# Patient Record
Sex: Female | Born: 1995
Health system: Southern US, Community
[De-identification: ages and names within clinical notes are randomized; demographics above are authoritative.]

## PROBLEM LIST (undated history)

## (undated) DIAGNOSIS — S022XXA Fracture of nasal bones, initial encounter for closed fracture: Secondary | ICD-10-CM

## (undated) DIAGNOSIS — T50901A Poisoning by unspecified drugs, medicaments and biological substances, accidental (unintentional), initial encounter: Secondary | ICD-10-CM

## (undated) DIAGNOSIS — F329 Major depressive disorder, single episode, unspecified: Secondary | ICD-10-CM

## (undated) DIAGNOSIS — F419 Anxiety disorder, unspecified: Secondary | ICD-10-CM

## (undated) DIAGNOSIS — J181 Lobar pneumonia, unspecified organism: Secondary | ICD-10-CM

## (undated) HISTORY — PX: OTHER SURGICAL HISTORY: SHX169

## (undated) HISTORY — DX: Anxiety disorder, unspecified: F41.9

## (undated) HISTORY — DX: Fracture of nasal bones, initial encounter for closed fracture: S02.2XXA

## (undated) HISTORY — DX: Poisoning by unspecified drugs, medicaments and biological substances, accidental (unintentional), initial encounter: T50.901A

## (undated) HISTORY — DX: Major depressive disorder, single episode, unspecified: F32.9

## (undated) HISTORY — DX: Lobar pneumonia, unspecified organism: J18.1

---
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
## 2014-07-01 ENCOUNTER — Telehealth: Payer: Self-pay | Admitting: Internal Medicine

## 2014-07-01 NOTE — Telephone Encounter (Signed)
Pt need a physical  before  8/12 16 can we work her in.

## 2014-07-01 NOTE — Telephone Encounter (Signed)
Lm on Mom voice mail to call back and reschedule .

## 2014-07-01 NOTE — Telephone Encounter (Signed)
yes

## 2014-07-02 NOTE — Telephone Encounter (Signed)
Pt has been sch

## 2014-07-23 ENCOUNTER — Encounter: Payer: BLUE CROSS/BLUE SHIELD | Admitting: Internal Medicine

## 2014-07-23 NOTE — Progress Notes (Signed)
Document opened and reviewed for wellness visit . No showed .  

## 2015-08-20 ENCOUNTER — Telehealth: Payer: Self-pay | Admitting: Family Medicine

## 2015-08-20 NOTE — Telephone Encounter (Signed)
Received a fax from [facility] with a note attached from the patient's mother.  Message stated, "Pamela [name] has lost her medicine and Dr. Marylene Buerger office is not refilling because she missed her last appointment 2 weeks ago.  Pamela [name] is in summer school up in [place] and couldn't make the appointment.  Please -  Please help.  Thanks!  We are in the process of trying to get Pamela [name] in with a practice up in [facility]  Dr. Fabian Sharp reviewed chart.  Pt last seen on 07/05/2013.  Dr. Fabian Sharp recommended that the pt seek help at an urgent care or health center at the college.  I called to speak to the pt.  Received mother of pt.  Informed her that I could not discuss with her and will need to speak to the pt.  She proceeded to inform me that Deloria Lair is in [place] but has found her medication.  Informed her to have Pamela [name] call me if she needed to discuss.  Mom agrees.

## 2015-09-04 ENCOUNTER — Telehealth: Payer: Self-pay | Admitting: Internal Medicine

## 2015-09-04 NOTE — Telephone Encounter (Signed)
Meriam SpragueBeverly called to return [name]'s call and I told her what Dr. Fabian SharpPanosh said and she said that [place] can make an appointment in September to see Dr. Fabian SharpPanosh when she comes home from [place].

## 2015-09-04 NOTE — Telephone Encounter (Signed)
Left a message for a return call.

## 2015-09-04 NOTE — Telephone Encounter (Signed)
Pt need new Rx for duloxetine (CYMBALTA)  Pharm:  [facility] [address]  [place], [place]   Pt state that she is between psychologist and can not get in until September to see them.

## 2015-09-04 NOTE — Telephone Encounter (Signed)
Cannot prescribe medication without  Assessment  Last ov 2 years ago    Would need an  OV with us or another provider in [place]( psychologist dont prescribe medications)  IF she is in school  Go to the health center ans see if they can help in the interim.

## 2016-01-21 NOTE — Progress Notes (Signed)
Pre visit review using our clinic review tool, if applicable. No additional management support is needed unless otherwise documented below in the visit note.  Chief Complaint  Patient presents with  . Abdominal Pain    X6 months  . Weight Loss  . Anorexia  . Nausea  . Emesis    HPI: Pamela [name] 20 y.o.  Last seen over 2 years ago  Hx depression anxiety stable at this time  but comesintoday for acute visit for above .She actually is doing very well just on Cymbalta daily junior in school [employer] and good grades works as a Theatre stage managerhostess at times. She comes in today consider parents were concerned about some symptoms that she has had off and on for a number of months.  Vomited   After eating  When at home and parents concerned  Insidious onset over the last 6 months intermittent a.m. epigastric discomfort and vomiting and has stopped eating much breakfast because of this. No change in bowel habits blood in her stool fever travel.  Lost about 40 pounds  Not really trying  But ok with it    Worse in am sx of nausea and epigastric sx  When no foodin stomach   .   And if eats  Gets some better .    not every day but happens .  Once or twice  A week.   Tries to eat better.   A week ago had fever illness cold . Resolving .  Tobacco no , once a week etoh, no rd , ocass advil .   No diarrhea  .  No episode. Predating above  No one else  Sick  Exercise  Not much  Denies weight problem  Sleep about 8 hours . On cymbalta    For depression.  prescriber    Not a counselor . lmp 3-4 weeks ago  Denies risk preg  Reg menses  ROS: See pertinent positives and negatives per HPI. No UTI symptoms vaginal symptoms unusual rashes bleeding respiratory problems. Denies feeling that she could have a eating disorder.  Past Medical History:  Diagnosis Date  . Anxiety 10/11   Hospital Ingestion benadryl  . Concussion 6/11  . Depression 10/2009   Hospital ingestion benadryl  . DRUG OVERDOSE 10/27/2009   Qualifier:  Diagnosis of  By: Fabian SharpPanosh MD, Neta MendsWanda K   . Fractured nose 6/11  . LLL pneumonia (HCC) 11 /12  . Wheezing-associated respiratory infection (WARI) 03/06/2011    No family history on file.  Social History   Social History  . Marital status: Single    Spouse name: N/A  . Number of children: N/A  . Years of education: N/A   Social History Main Topics  . Smoking status: Never Smoker  . Smokeless tobacco: Never Used  . Alcohol use Yes     Comment: social  . Drug use: No  . Sexual activity: Not Asked   Other Topics Concern  . None   Social History Narrative   Mgm dm cad ?af on coumadin   ? If pgf undiagnosed depression   Paternal great aunt   Mom on wellbutrin and effexor for mom   ? 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       EXAM:  BP 98/68 (BP Location: Right Arm, Patient Position: Sitting, Cuff Size: Normal)  Temp 98.4 F (36.9 C) (Oral)   Wt 135 lb 9.6 oz (61.5 kg)   BMI 21.56 kg/m   Body mass index is 21.56 kg/m.  GENERAL: vitals reviewed and listed above, alert, oriented, appears well hydrated and in no acute distressNon-icteric. HEENT: atraumatic, conjunctiva  clear, no obvious abnormalities on inspection of external nose and ears OP : no lesion edema or exudate  NECK: no obvious masses on inspection palpation  No adenopathy  LUNGS: clear to auscultation bilaterally, no wheezes, rales or rhonchi, good air movement CV: HRRR, no clubbing cyanosis or  peripheral edema nl cap refill  Abdomen:  Sof,t normal bowel sounds without hepatosplenomegaly, no guarding rebound or masses no CVA tenderness Skin: normal capillary refill ,turgor , color: No acute rashes ,petechiae or bruisingm MS: moves all extremities without noticeable focal  abnormality PSYCH: pleasant and cooperative, no obvious depression or anxiety Lab Results  Component Value Date   WBC 7.9 01/23/2016   HGB 12.4 01/23/2016     HCT 37.7 01/23/2016   PLT 509.0 (H) 01/23/2016   GLUCOSE 85 01/23/2016   CHOL 155 07/05/2013   TRIG 125.0 07/05/2013   HDL 50.90 07/05/2013   LDLCALC 79 07/05/2013   ALT 16 01/23/2016   AST 16 01/23/2016   NA 138 01/23/2016   K 4.5 01/23/2016   CL 106 01/23/2016   CREATININE 0.64 01/23/2016   BUN 13 01/23/2016   CO2 27 01/23/2016   TSH 0.75 01/23/2016   HGBA1C 5.7 01/23/2016   Wt Readings from Last 3 Encounters:  01/23/16 135 lb 9.6 oz (61.5 kg)  07/05/13 142 lb (64.4 kg) (76 %, Z= 0.70)*  05/12/13 136 lb 3.2 oz (61.8 kg) (69 %, Z= 0.50)*   * Growth percentiles are based on CDC 2-20 Years data.    ASSESSMENT AND PLAN:  Discussed the following assessment and plan:  Non-intractable vomiting with nausea, unspecified vomiting type - Plan: Basic metabolic panel, CBC with Differential/Platelet, Hepatic function panel, Hemoglobin A1c, TSH, T4, free, POCT urinalysis dipstick, Sedimentation rate, POCT urine pregnancy, Helicobacter pylori antigen det, stool, US Abdomen Complete  Loss of weight - Plan: Basic metabolic panel, CBC with Differential/Platelet, Hepatic function panel, Hemoglobin A1c, TSH, T4, free, POCT urinalysis dipstick, Sedimentation rate, POCT urine pregnancy, Helicobacter pylori antigen det, stool, US Abdomen Complete  Epigastric discomfort - Plan: Basic metabolic panel, CBC with Differential/Platelet, Hepatic function panel, Hemoglobin A1c, TSH, T4, free, POCT urinalysis dipstick, Sedimentation rate, POCT urine pregnancy, Helicobacter pylori antigen det, stool, US Abdomen Complete Plan laboratory studies consideration for peptic ulcer disease but doesn't run in the family no other risk factors considering getting abdominal ultrasound GI consult depending on results. Exam is reassuring today. -Patient advised to return or notify health care team  if symptoms worsen ,persist or new concerns arise.  Patient Instructions  Let she know when lab tests her back in contact  you about getting an abdominal ultrasound test. It is possible you could have a gastritis or an ulcer. Collect stool for H. pylori infection. After all collected we may add an acid blocker and get an opinion from the GI doctors. Follow-up depending on labs in progress.    Neta MendsWanda K. Ryli Standlee M.D.

## 2016-01-23 ENCOUNTER — Encounter: Payer: Self-pay | Admitting: Internal Medicine

## 2016-01-23 ENCOUNTER — Other Ambulatory Visit: Payer: Self-pay | Admitting: Internal Medicine

## 2016-01-23 ENCOUNTER — Ambulatory Visit (INDEPENDENT_AMBULATORY_CARE_PROVIDER_SITE_OTHER): Payer: BLUE CROSS/BLUE SHIELD | Admitting: Internal Medicine

## 2016-01-23 VITALS — BP 98/68 | Temp 98.4°F | Wt 135.6 lb

## 2016-01-23 DIAGNOSIS — R1013 Epigastric pain: Secondary | ICD-10-CM

## 2016-01-23 DIAGNOSIS — R634 Abnormal weight loss: Secondary | ICD-10-CM | POA: Diagnosis not present

## 2016-01-23 DIAGNOSIS — R112 Nausea with vomiting, unspecified: Secondary | ICD-10-CM

## 2016-01-23 LAB — CBC WITH DIFFERENTIAL/PLATELET
Basophils Absolute: 0 10*3/uL (ref 0.0–0.1)
Basophils Relative: 0.4 % (ref 0.0–3.0)
Eosinophils Absolute: 0.1 10*3/uL (ref 0.0–0.7)
Eosinophils Relative: 1 % (ref 0.0–5.0)
HCT: 37.7 % (ref 36.0–46.0)
Hemoglobin: 12.4 g/dL (ref 12.0–15.0)
Lymphocytes Relative: 27.2 % (ref 12.0–46.0)
Lymphs Abs: 2.2 10*3/uL (ref 0.7–4.0)
MCHC: 33 g/dL (ref 30.0–36.0)
MCV: 85.1 fl (ref 78.0–100.0)
Monocytes Absolute: 0.6 10*3/uL (ref 0.1–1.0)
Monocytes Relative: 7.3 % (ref 3.0–12.0)
Neutro Abs: 5.1 10*3/uL (ref 1.4–7.7)
Neutrophils Relative %: 64.1 % (ref 43.0–77.0)
Platelets: 509 10*3/uL — ABNORMAL HIGH (ref 150.0–400.0)
RBC: 4.43 Mil/uL (ref 3.87–5.11)
RDW: 15.6 % — ABNORMAL HIGH (ref 11.5–15.5)
WBC: 7.9 10*3/uL (ref 4.0–10.5)

## 2016-01-23 LAB — POCT URINE PREGNANCY: Preg Test, Ur: NEGATIVE

## 2016-01-23 LAB — BASIC METABOLIC PANEL
BUN: 13 mg/dL (ref 6–23)
CO2: 27 mEq/L (ref 19–32)
Calcium: 9 mg/dL (ref 8.4–10.5)
Chloride: 106 mEq/L (ref 96–112)
Creatinine, Ser: 0.64 mg/dL (ref 0.40–1.20)
GFR: 124.38 mL/min (ref >60.00)
Glucose, Bld: 85 mg/dL (ref 70–99)
Potassium: 4.5 mEq/L (ref 3.5–5.1)
Sodium: 138 mEq/L (ref 135–145)

## 2016-01-23 LAB — POCT URINALYSIS DIP (MANUAL ENTRY)
Bilirubin, UA: NEGATIVE
Blood, UA: NEGATIVE
Glucose, UA: NEGATIVE
Ketones, POC UA: NEGATIVE
Leukocytes, UA: NEGATIVE
Nitrite, UA: NEGATIVE
Spec Grav, UA: 1.015
Urobilinogen, UA: 0.2
pH, UA: 7

## 2016-01-23 LAB — HEMOGLOBIN A1C: Hgb A1c MFr Bld: 5.7 % (ref 4.6–6.5)

## 2016-01-23 LAB — HEPATIC FUNCTION PANEL
ALT: 16 U/L (ref 0–35)
AST: 16 U/L (ref 0–37)
Albumin: 3.7 g/dL (ref 3.5–5.2)
Alkaline Phosphatase: 48 U/L (ref 39–117)
Bilirubin, Direct: 0.1 mg/dL (ref 0.0–0.3)
Total Bilirubin: 0.3 mg/dL (ref 0.2–1.2)
Total Protein: 5.7 g/dL — ABNORMAL LOW (ref 6.0–8.3)

## 2016-01-23 LAB — TSH: TSH: 0.75 u[IU]/mL (ref 0.35–4.50)

## 2016-01-23 LAB — T4, FREE: Free T4: 0.83 ng/dL (ref 0.60–1.60)

## 2016-01-23 LAB — SEDIMENTATION RATE: Sed Rate: 6 mm/hr (ref 0–20)

## 2016-01-23 NOTE — Patient Instructions (Signed)
Let she know when lab tests her back in contact you about getting an abdominal ultrasound test. It is possible you could have a gastritis or an ulcer. Collect stool for H. pylori infection. After all collected we may add an acid blocker and get an opinion from the GI doctors. Follow-up depending on labs in progress.

## 2016-01-28 LAB — HELICOBACTER PYLORI  SPECIAL ANTIGEN: H. PYLORI Antigen: NOT DETECTED

## 2016-02-06 ENCOUNTER — Other Ambulatory Visit: Payer: Self-pay | Admitting: Family Medicine

## 2016-02-06 MED ORDER — RANITIDINE HCL 150 MG PO TABS: 150.0000 mg | ORAL_TABLET | Freq: Two times a day (BID) | ORAL | 1 refills | Status: DC

## 2016-04-23 DIAGNOSIS — Y906 Blood alcohol level of 120-199 mg/100 ml: Secondary | ICD-10-CM | POA: Diagnosis not present

## 2016-04-23 DIAGNOSIS — F329 Major depressive disorder, single episode, unspecified: Secondary | ICD-10-CM | POA: Diagnosis not present

## 2016-04-23 DIAGNOSIS — F10120 Alcohol abuse with intoxication, uncomplicated: Secondary | ICD-10-CM | POA: Diagnosis not present

## 2016-04-23 DIAGNOSIS — R45851 Suicidal ideations: Secondary | ICD-10-CM | POA: Diagnosis not present

## 2016-10-15 ENCOUNTER — Encounter: Payer: Self-pay | Admitting: Internal Medicine

## 2017-01-28 ENCOUNTER — Telehealth: Payer: Self-pay | Admitting: Internal Medicine

## 2017-01-28 NOTE — Telephone Encounter (Signed)
LM for mother Meriam SpragueBeverly making aware that the patient will need an OV before prescribing this medication.

## 2017-01-28 NOTE — Telephone Encounter (Signed)
Mother calling to request a refill of the patients Cymbalta to [facility].  Fax received this morning for the same request and has been dicussed with Dr Fabian SharpPanosh.  Cymbalta has never been filled by Dr Fabian SharpPanosh or our office.  Last seen 12/2015 by Dr Fabian SharpPanosh.  Pt mother aware that Dr Fabian SharpPanosh wants to review to the patient's chart before saying yes or no.  Aware that we will back.

## 2017-01-28 NOTE — Telephone Encounter (Signed)
Needs OV  If needs us to prescribe medication.   Last Ov with me was  Over a year ago .  And I havent been prescribing this medicine .

## 2017-02-02 NOTE — Telephone Encounter (Signed)
LM for mother Pamela [name] x 2

## 2017-02-09 NOTE — Telephone Encounter (Signed)
LM x 3 Mychart message sent.  Will close encounter.

## 2018-01-27 NOTE — Progress Notes (Signed)
Chief Complaint  Patient presents with  . Annual Exam    Wants to talk about about getting a nonhormonal IUD. Is going to [place] and needs vaccines     HPI: Patient  Pamela [name]  23 y.o. comes in today for Preventive Health Care visit  [employer]   Major communication.  science and disorders.  Just gaduated  Wants to do more in   Speech therapy.  Was seeing prescriber for depression on campus and has 90 days at grad but will need a new prescribe and can we do this? Seems ok and denies suicidal or  New sx  Function ing fairly well. No local counselor but agrees could be a good idea  Has had vag exam    No pap.   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  Will need immuniz and from utd  And medical cert to be done   Health Maintenance  Topic Date Due  . HIV Screening  12/15/2009  . TETANUS/TDAP  12/15/2013  . INFLUENZA VACCINE  08/25/2017  . PAP-Cervical Cytology Screening  01/30/2018 (Originally 12/16/2015)  . PAP SMEAR-Modifier  01/30/2018 (Originally 12/16/2015)   Health Maintenance Review LIFESTYLE:  Exercise:   Not a lot .  In an aout.  Tobacco/ETS:no Alcohol: 2 per week  Sugar beverages:n Sleep: about out 7 houors  Drug use: no  HH of  At home   ROS:  GEN/ HEENT: No fever, significant weight changes sweats headaches vision problems hearing changes, CV/ PULM; No chest pain shortness of breath cough, syncope,edema  change in exercise tolerance. GI /GU: om going abd sx when runs but no, vomiting, change in bowel habits. No blood in the stool. No significant GU symptoms. No weight loss w this  Never followed up wioth the abd pain over 2 years ago  SKIN/HEME: ,no acute skin rashes suspicious lesions or bleeding. No lymphadenopathy, nodules, masses.  NEURO/ PSYCH:  No neurologic signs such as weakness numbness. No depression anxiety. IMM/ Allergy: No unusual infections.  Allergy .   REST of 12 system review negative except as per HPI   Past Medical History:   Diagnosis Date  . Anxiety 10/11   Hospital Ingestion benadryl  . Concussion 6/11  . Depression 10/2009   Hospital ingestion benadryl  . DRUG OVERDOSE 10/27/2009   Qualifier: Diagnosis of  By: Regis Bill MD, Standley Brooking   . Fractured nose 6/11  . LLL pneumonia ([facility]) 11 /12  . Wheezing-associated respiratory infection (WARI) 03/06/2011    Past Surgical History:  Procedure Laterality Date  . Nose fracture surgery      No family history on file.  Social History   Socioeconomic History  . Marital status: Single    Spouse name: Not on file  . Number of children: Not on file  . Years of education: Not on file  . Highest education level: Not on file  Occupational History  . Not on file  Social Needs  . Financial resource strain: Not on file  . Food insecurity:    Worry: Not on file    Inability: Not on file  . Transportation needs:    Medical: Not on file    Non-medical: Not on file  Tobacco Use  . Smoking status: Never Smoker  . Smokeless tobacco: Never Used  Substance and Sexual Activity  . Alcohol use: Yes    Alcohol/week: 4.0 standard drinks    Types: 4 Cans of beer per week  Comment: social  . Drug use: No  . Sexual activity: Not Currently  Lifestyle  . Physical activity:    Days per week: Not on file    Minutes per session: Not on file  . Stress: Not on file  Relationships  . Social connections:    Talks on phone: Not on file    Gets together: Not on file    Attends religious service: Not on file    Active member of club or organization: Not on file    Attends meetings of clubs or organizations: Not on file    Relationship status: Not on file  Other Topics Concern  . Not on file  Social History Narrative   Mgm dm cad ?af on coumadin   ? If pgf undiagnosed depression   Paternal great aunt   Mom on wellbutrin and effexor for mom   ? 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     Outpatient Medications Prior to Visit  Medication Sig Dispense Refill  . DULoxetine (CYMBALTA) 30 MG capsule Take 90 mg by mouth daily.    . ranitidine (ZANTAC) 150 MG tablet Take 1 tablet (150 mg total) by mouth 2 (two) times daily. 60 tablet 1   No facility-administered medications prior to visit.      EXAM:  BP 116/62 (BP Location: Left Arm, Patient Position: Sitting, Cuff Size: Normal)   Pulse 88   Temp 98 F (36.7 C) (Oral)   Ht 5' 6.5" (1.689 m)   Wt 160 lb 3.2 oz (72.7 kg)   LMP 01/03/2018 (Approximate)   BMI 25.47 kg/m   Body mass index is 25.47 kg/m. Wt Readings from Last 3 Encounters:  01/30/18 160 lb 3.2 oz (72.7 kg)  01/23/16 135 lb 9.6 oz (61.5 kg)  07/05/13 142 lb (64.4 kg) (76 %, Z= 0.70)*   * Growth percentiles are based on CDC (Girls, 2-20 Years) data.    Physical Exam: Vital signs reviewed NTZ:GYFV is a well-developed well-nourished alert cooperative    who appearsr stated age in no acute distress.  HEENT: normocephalic atraumatic , Eyes: PERRL EOM's full, conjunctiva clear, Nares: paten,t no deformity discharge or tenderness., Ears: no deformity EAC's clear TMs with normal landmarks. Mouth: clear OP, no lesions, edema.  Moist mucous membranes. Dentition in adequate repair. NECK: supple without masses, thyromegaly or bruits. CHEST/PULM:  Clear to auscultation and percussion breath sounds equal no wheeze , rales or rhonchi. No chest wall deformities or tenderness. Breast: normal by inspection . No dimpling, discharge, masses, tenderness or discharge . CV: PMI is nondisplaced, S1 S2 no gallops, murmurs, rubs. Peripheral pulses are full without delay.No JVD .  ABDOMEN: Bowel sounds normal nontender  No guard or rebound, no hepato splenomegal no CVA tenderness.  No hernia. Extremtities:  No clubbing cyanosis or edema, no acute joint swelling or redness no focal atrophy NEURO:  Oriented x3, cranial nerves 3-12 appear to be intact, no obvious focal  weakness,gait within normal limits no abnormal reflexes or asymmetrical SKIN: No acute rashes normal turgor, color, no bruising or petechiae. PSYCH: Oriented, good eye contact, no obvious depression anxiety, cognition and judgment appear normal. LN: no cervical axillary inguinal adenopathy  Lab Results  Component Value Date   WBC 7.9 01/23/2016   HGB 12.4 01/23/2016   HCT 37.7 01/23/2016   PLT 509.0 (H) 01/23/2016  GLUCOSE 85 01/23/2016   CHOL 155 07/05/2013   TRIG 125.0 07/05/2013   HDL 50.90 07/05/2013   LDLCALC 79 07/05/2013   ALT 16 01/23/2016   AST 16 01/23/2016   NA 138 01/23/2016   K 4.5 01/23/2016   CL 106 01/23/2016   CREATININE 0.64 01/23/2016   BUN 13 01/23/2016   CO2 27 01/23/2016   TSH 0.75 01/23/2016   HGBA1C 5.7 01/23/2016    BP Readings from Last 3 Encounters:  01/30/18 116/62  01/23/16 98/68  07/05/13 114/74     ASSESSMENT AND PLAN:  Discussed the following assessment and plan:  Visit for preventive health examination  Medication management  Depression, major, single episode, complete remission ([place]) - apparent remission on high dose cymbalta and ok to take over meds  at this time disc local counseling [place] if needed  Stomach ache - hx of such off and on for years  no preogression had initial eval  check into diet changes fu if progressive   .try lactose free diet.consdier celiac screen   Encounter for other general counseling or advice on contraception - dsic iud avoiding hromonal cause of poss mood se  advise get gyne appt   Immunization counseling Dysmenorrhea.  Sounds primary  Contraceptive need and will need pap  Needs medical certificate done but need her immuniz childhood seem  utd x tdap?   Send in  Typhoid rx and then   She will bring back the form for completion after getting immuniz records  Prolonged visit today  Get pap and iud consult via gyne and sti screen if needed (?low risk) Patient Care Team: Hanya Guerin, Standley Brooking, MD as PCP  - General Oneil,Leslie Patient Instructions  [place] for [place] to   Take over the Cymbalta  . Can refill with [place]  Get appt with   Gyne   For IUD.      And  Pap smear as indicated .   [facility]  [phone]   But there are other options  consider  Lactose elimination  Diet .  For chronic abd pain .   [place] send in typhoid vaccine   And will need to get yellow fever vaccine at  [facility] department or travle  Department.   Need  You to get a copy of full immunization record .  State registry idoes not show your  mmr and  Looks like  Your are due for tdap before travel .  You can make appt for nurse visit   For these injections if needed before travel.   Consider getting in with local counselor.     Preventive Care for [place], Female The transition to life after high school as a young adult can be a stressful time with many changes. You may start seeing a primary care physician instead of a pediatrician. This is the time when your health care becomes your responsibility. Preventive care refers to lifestyle choices and visits with your health care provider that can promote health and wellness. What does preventive care include?  A yearly physical exam. This is also called an annual wellness visit.  Dental exams once or twice a year.  Routine eye exams. Ask your health care provider how often you should have your eyes checked.  Personal lifestyle choices, including: ? Daily care of your teeth and gums. ? Regular physical activity. ? Eating a healthy diet. ? Avoiding tobacco and drug use. ? Avoiding or limiting alcohol use. ? Practicing safe sex. ? Taking  vitamin and mineral supplements as recommended by your health care provider. What happens during an annual wellness visit? Preventive care starts with a yearly visit to your primary care physician. The services and screenings done by your health care provider during your annual wellness visit will depend on your overall health,  lifestyle risk factors, and family history of disease. Counseling Your health care provider may ask you questions about:  Past medical problems and your family's medical history.  Medicines or supplements you take.  Health insurance and access to health care.  Alcohol, tobacco, and drug use.  Your safety at home, work, or school.  Access to firearms.  Emotional well-being and how you cope with stress.  Relationship well-being.  Diet, exercise, and sleep habits.  Your sexual health and activity.  Your methods of birth control.  Your menstrual cycle.  Your pregnancy history. Screening You may have the following tests or measurements:  Height, weight, and BMI.  Blood pressure.  Lipid and cholesterol levels.  Tuberculosis skin test.  Skin exam.  Vision and hearing tests.  Screening test for hepatitis.  Screening tests for sexually transmitted diseases (STDs), if you are at risk.  BRCA-related cancer screening. This may be done if you have a family history of breast, ovarian, tubal, or peritoneal cancers.  Pelvic exam and Pap test. This may be done every 3 years starting at age 27. Vaccines Your health care provider may recommend certain vaccines, such as:  Influenza vaccine. This is recommended every year.  Tetanus, diphtheria, and acellular pertussis (Tdap, Td) vaccine. You may need a Td booster every 10 years.  Varicella vaccine. You may need this if you have not been vaccinated.  HPV vaccine. If you are 44 or younger, you may need three doses over 6 months.  Measles, mumps, and rubella (MMR) vaccine. You may need at least one dose of MMR. You may also need a second dose.  Pneumococcal 13-valent conjugate (PCV13) vaccine. You may need this if you have certain conditions and were not previously vaccinated.  Pneumococcal polysaccharide (PPSV23) vaccine. You may need one or two doses if you smoke cigarettes or if you have certain  conditions.  Meningococcal vaccine. One dose is recommended if you are age 64-21 years and a first-year college student living in a residence hall, or if you have one of several medical conditions. You may also need additional booster doses.  Hepatitis A vaccine. You may need this if you have certain conditions or if you travel or work in places where you may be exposed to hepatitis A.  Hepatitis B vaccine. You may need this if you have certain conditions or if you travel or work in places where you may be exposed to hepatitis B.  Haemophilus influenzae type b (Hib) vaccine. You may need this if you have certain risk factors. Talk to your health care provider about which screenings and vaccines you need and how often you need them. What steps can I take to develop healthy behaviors?      Have regular preventive health care visits with your primary care physician and dentist.  Eat a healthy diet.  Drink enough fluid to keep your urine pale yellow.  Stay active. Exercise at least 30 minutes 5 or more days of the week.  Use alcohol responsibly.  Maintain a healthy weight.  Do not use any products that contain nicotine, such as cigarettes, chewing tobacco, and e-cigarettes. If you need help quitting, ask your health care provider.  Do not  use drugs.  Practice safe sex.  Use birth control (contraception) to prevent unwanted pregnancy. If you plan to become pregnant, see your health care provider for a pre-conception visit.  Find healthy ways to manage stress. How can I protect myself from injury? Injuries from violence or accidents are the leading cause of death among young adults and can often be prevented. Take these steps to help protect yourself:  Always wear your seat belt while driving or riding in a vehicle.  Do not drive if you have been drinking alcohol. Do not ride with someone who has been drinking.  Do not drive when you are tired or distracted. Do not text while  driving.  Wear a helmet and other protective equipment during sports activities.  If you have firearms in your house, make sure you follow all gun safety procedures.  Seek help if you have been bullied, physically abused, or sexually abused.  Use the Internet responsibly to avoid dangers such as online bullying and online sexual predators. What can I do to cope with stress? Young adults may face many new challenges that can be stressful, such as finding a job, going to college, moving away from home, managing money, being in a relationship, getting married, and having children. To manage stress:  Avoid known stressful situations when you can.  Exercise regularly.  Find a stress-reducing activity that works best for you. Examples include meditation, yoga, listening to music, or reading.  Spend time in nature.  Keep a journal to write about your stress and how you respond.  Talk to your health care provider about stress. He or she may suggest counseling.  Spend time with supportive friends or family.  Do not cope with stress by: ? Drinking alcohol or using drugs. ? Smoking cigarettes. ? Eating. Where can I get more information? Learn more about preventive care and healthy habits from:  [place] and Gynecologists: KaraokeExchange.nl  U.S. Probation officer Task Force: StageSync.si  National Adolescent and [place]: StrategicRoad.nl  American Academy of Pediatrics Bright Futures: https://brightfutures.MemberVerification.co.za  Society for Adolescent Health and Medicine: MoralBlog.co.za.aspx  PodExchange.nl: ToyLending.fr This information is not intended to replace advice given to you by your health care provider. Make  sure you discuss any questions you have with your health care provider. Document Released: 05/29/2015 Document Revised: 08/24/2016 Document Reviewed: 05/29/2015 Elsevier Interactive Patient Education  2019 [place] K. Jennafer Gladue M.D.

## 2018-01-30 ENCOUNTER — Encounter: Payer: Self-pay | Admitting: Internal Medicine

## 2018-01-30 ENCOUNTER — Ambulatory Visit (INDEPENDENT_AMBULATORY_CARE_PROVIDER_SITE_OTHER): Payer: BLUE CROSS/BLUE SHIELD | Admitting: Internal Medicine

## 2018-01-30 VITALS — BP 116/62 | HR 88 | Temp 98.0°F | Ht 66.5 in | Wt 160.2 lb

## 2018-01-30 DIAGNOSIS — R109 Unspecified abdominal pain: Secondary | ICD-10-CM

## 2018-01-30 DIAGNOSIS — Z0000 Encounter for general adult medical examination without abnormal findings: Secondary | ICD-10-CM

## 2018-01-30 DIAGNOSIS — Z79899 Other long term (current) drug therapy: Secondary | ICD-10-CM | POA: Diagnosis not present

## 2018-01-30 DIAGNOSIS — Z3009 Encounter for other general counseling and advice on contraception: Secondary | ICD-10-CM | POA: Diagnosis not present

## 2018-01-30 DIAGNOSIS — F325 Major depressive disorder, single episode, in full remission: Secondary | ICD-10-CM

## 2018-01-30 DIAGNOSIS — Z7189 Other specified counseling: Secondary | ICD-10-CM | POA: Diagnosis not present

## 2018-01-30 DIAGNOSIS — Z7185 Encounter for immunization safety counseling: Secondary | ICD-10-CM

## 2018-01-30 MED ORDER — TYPHOID VACCINE PO CPDR: 1.0000 | DELAYED_RELEASE_CAPSULE | ORAL | 0 refills | Status: DC

## 2018-01-30 MED ORDER — DULOXETINE HCL 60 MG PO CPEP: 60.0000 mg | ORAL_CAPSULE | Freq: Two times a day (BID) | ORAL | 0 refills | Status: DC

## 2018-01-30 NOTE — Patient Instructions (Addendum)
[place] for [place] to   Take over the Cymbalta  . Can refill with [place]  Get appt with   Gyne   For IUD.      And  Pap smear as indicated .   [facility]  [phone]   But there are other options  consider  Lactose elimination  Diet .  For chronic abd pain .   [place] send in typhoid vaccine   And will need to get yellow fever vaccine at  [facility] department or travle  Department.   Need  You to get a copy of full immunization record .  State registry idoes not show your  mmr and  Looks like  Your are due for tdap before travel .  You can make appt for nurse visit   For these injections if needed before travel.   Consider getting in with local counselor.     Preventive Care for [place], Female The transition to life after high school as a young adult can be a stressful time with many changes. You may start seeing a primary care physician instead of a pediatrician. This is the time when your health care becomes your responsibility. Preventive care refers to lifestyle choices and visits with your health care provider that can promote health and wellness. What does preventive care include?  A yearly physical exam. This is also called an annual wellness visit.  Dental exams once or twice a year.  Routine eye exams. Ask your health care provider how often you should have your eyes checked.  Personal lifestyle choices, including: ? Daily care of your teeth and gums. ? Regular physical activity. ? Eating a healthy diet. ? Avoiding tobacco and drug use. ? Avoiding or limiting alcohol use. ? Practicing safe sex. ? Taking vitamin and mineral supplements as recommended by your health care provider. What happens during an annual wellness visit? Preventive care starts with a yearly visit to your primary care physician. The services and screenings done by your health care provider during your annual wellness visit will depend on your overall health, lifestyle risk factors, and family history of  disease. Counseling Your health care provider may ask you questions about:  Past medical problems and your family's medical history.  Medicines or supplements you take.  Health insurance and access to health care.  Alcohol, tobacco, and drug use.  Your safety at home, work, or school.  Access to firearms.  Emotional well-being and how you cope with stress.  Relationship well-being.  Diet, exercise, and sleep habits.  Your sexual health and activity.  Your methods of birth control.  Your menstrual cycle.  Your pregnancy history. Screening You may have the following tests or measurements:  Height, weight, and BMI.  Blood pressure.  Lipid and cholesterol levels.  Tuberculosis skin test.  Skin exam.  Vision and hearing tests.  Screening test for hepatitis.  Screening tests for sexually transmitted diseases (STDs), if you are at risk.  BRCA-related cancer screening. This may be done if you have a family history of breast, ovarian, tubal, or peritoneal cancers.  Pelvic exam and Pap test. This may be done every 3 years starting at age 65. Vaccines Your health care provider may recommend certain vaccines, such as:  Influenza vaccine. This is recommended every year.  Tetanus, diphtheria, and acellular pertussis (Tdap, Td) vaccine. You may need a Td booster every 10 years.  Varicella vaccine. You may need this if you have not been vaccinated.  HPV vaccine. If  you are 11 or younger, you may need three doses over 6 months.  Measles, mumps, and rubella (MMR) vaccine. You may need at least one dose of MMR. You may also need a second dose.  Pneumococcal 13-valent conjugate (PCV13) vaccine. You may need this if you have certain conditions and were not previously vaccinated.  Pneumococcal polysaccharide (PPSV23) vaccine. You may need one or two doses if you smoke cigarettes or if you have certain conditions.  Meningococcal vaccine. One dose is recommended if you are  age 31-21 years and a first-year college student living in a residence hall, or if you have one of several medical conditions. You may also need additional booster doses.  Hepatitis A vaccine. You may need this if you have certain conditions or if you travel or work in places where you may be exposed to hepatitis A.  Hepatitis B vaccine. You may need this if you have certain conditions or if you travel or work in places where you may be exposed to hepatitis B.  Haemophilus influenzae type b (Hib) vaccine. You may need this if you have certain risk factors. Talk to your health care provider about which screenings and vaccines you need and how often you need them. What steps can I take to develop healthy behaviors?      Have regular preventive health care visits with your primary care physician and dentist.  Eat a healthy diet.  Drink enough fluid to keep your urine pale yellow.  Stay active. Exercise at least 30 minutes 5 or more days of the week.  Use alcohol responsibly.  Maintain a healthy weight.  Do not use any products that contain nicotine, such as cigarettes, chewing tobacco, and e-cigarettes. If you need help quitting, ask your health care provider.  Do not use drugs.  Practice safe sex.  Use birth control (contraception) to prevent unwanted pregnancy. If you plan to become pregnant, see your health care provider for a pre-conception visit.  Find healthy ways to manage stress. How can I protect myself from injury? Injuries from violence or accidents are the leading cause of death among young adults and can often be prevented. Take these steps to help protect yourself:  Always wear your seat belt while driving or riding in a vehicle.  Do not drive if you have been drinking alcohol. Do not ride with someone who has been drinking.  Do not drive when you are tired or distracted. Do not text while driving.  Wear a helmet and other protective equipment during sports  activities.  If you have firearms in your house, make sure you follow all gun safety procedures.  Seek help if you have been bullied, physically abused, or sexually abused.  Use the Internet responsibly to avoid dangers such as online bullying and online sexual predators. What can I do to cope with stress? Young adults may face many new challenges that can be stressful, such as finding a job, going to college, moving away from home, managing money, being in a relationship, getting married, and having children. To manage stress:  Avoid known stressful situations when you can.  Exercise regularly.  Find a stress-reducing activity that works best for you. Examples include meditation, yoga, listening to music, or reading.  Spend time in nature.  Keep a journal to write about your stress and how you respond.  Talk to your health care provider about stress. He or she may suggest counseling.  Spend time with supportive friends or family.  Do not  cope with stress by: ? Drinking alcohol or using drugs. ? Smoking cigarettes. ? Eating. Where can I get more information? Learn more about preventive care and healthy habits from:  [place] and Gynecologists: KaraokeExchange.nl  U.S. Probation officer Task Force: StageSync.si  National Adolescent and [place]: StrategicRoad.nl  American Academy of Pediatrics Bright Futures: https://brightfutures.MemberVerification.co.za  Society for Adolescent Health and Medicine: MoralBlog.co.za.aspx  PodExchange.nl: ToyLending.fr This information is not intended to replace advice given to you by your health care provider. Make sure you discuss any questions you have with your health care  provider. Document Released: 05/29/2015 Document Revised: 08/24/2016 Document Reviewed: 05/29/2015 Elsevier Interactive Patient Education  2019 [employer].

## 2018-02-09 ENCOUNTER — Ambulatory Visit: Payer: BLUE CROSS/BLUE SHIELD | Admitting: Internal Medicine

## 2018-02-09 ENCOUNTER — Encounter: Payer: Self-pay | Admitting: Internal Medicine

## 2018-02-09 ENCOUNTER — Ambulatory Visit: Payer: Self-pay | Admitting: Internal Medicine

## 2018-02-09 VITALS — BP 120/72 | HR 88 | Temp 98.9°F | Wt 158.0 lb

## 2018-02-09 DIAGNOSIS — Z6825 Body mass index (BMI) 25.0-25.9, adult: Secondary | ICD-10-CM | POA: Diagnosis not present

## 2018-02-09 DIAGNOSIS — R21 Rash and other nonspecific skin eruption: Secondary | ICD-10-CM

## 2018-02-09 DIAGNOSIS — Z01419 Encounter for gynecological examination (general) (routine) without abnormal findings: Secondary | ICD-10-CM | POA: Diagnosis not present

## 2018-02-09 DIAGNOSIS — Z79899 Other long term (current) drug therapy: Secondary | ICD-10-CM | POA: Diagnosis not present

## 2018-02-09 DIAGNOSIS — Z113 Encounter for screening for infections with a predominantly sexual mode of transmission: Secondary | ICD-10-CM | POA: Diagnosis not present

## 2018-02-09 DIAGNOSIS — F325 Major depressive disorder, single episode, in full remission: Secondary | ICD-10-CM | POA: Diagnosis not present

## 2018-02-09 DIAGNOSIS — Z124 Encounter for screening for malignant neoplasm of cervix: Secondary | ICD-10-CM | POA: Diagnosis not present

## 2018-02-09 DIAGNOSIS — Z23 Encounter for immunization: Secondary | ICD-10-CM

## 2018-02-09 DIAGNOSIS — Z3040 Encounter for surveillance of contraceptives, unspecified: Secondary | ICD-10-CM | POA: Diagnosis not present

## 2018-02-09 MED ORDER — DULOXETINE HCL 60 MG PO CPEP: 60.0000 mg | ORAL_CAPSULE | Freq: Two times a day (BID) | ORAL | 0 refills | Status: DC

## 2018-02-09 MED ORDER — MUPIROCIN CALCIUM 2 % EX CREA: 1.0000 "application " | TOPICAL_CREAM | Freq: Three times a day (TID) | CUTANEOUS | 3 refills | Status: DC

## 2018-02-09 NOTE — Patient Instructions (Addendum)
  Flu vaccine and tdap today .   Meds  sent to your pharmacy .   Have a great experience

## 2018-02-09 NOTE — Progress Notes (Signed)
Chief Complaint  Patient presents with  . 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 come in for update immuniz   Form  And rx  cymbalta 60 bid and bactroban for intermittent facial ? Staph infection  ( not hxv and not in same place)  Seen by derm in past  Form  needed for  overeseas program   Needs refill meds for a year  immuniz update  Feels depression is stable and doing ok    ROS: See pertinent positives and negatives per HPI.  Past Medical History:  Diagnosis Date  . Anxiety 10/11   Hospital Ingestion benadryl  . Concussion 6/11  . Depression 10/2009   Hospital ingestion benadryl  . DRUG OVERDOSE 10/27/2009   Qualifier: Diagnosis of  By: Fabian Sharp MD, Neta Mends   . Fractured nose 6/11  . LLL pneumonia (HCC) 11 /12  . Wheezing-associated respiratory infection (WARI) 03/06/2011    No family history on file.  Social History   Socioeconomic History  . Marital status: Single    Spouse name: Not on file  . Number of children: Not on file  . Years of education: Not on file  . Highest education level: Not on file  Occupational History  . Not on file  Social Needs  . Financial resource strain: Not on file  . Food insecurity:    Worry: Not on file    Inability: Not on file  . Transportation needs:    Medical: Not on file    Non-medical: Not on file  Tobacco Use  . Smoking status: Never Smoker  . Smokeless tobacco: Never Used  Substance and Sexual Activity  . Alcohol use: Yes    Alcohol/week: 4.0 standard drinks    Types: 4 Cans of beer per week    Comment: social  . Drug use: No  . Sexual activity: Not Currently  Lifestyle  . Physical activity:    Days per week: Not on file    Minutes per session: Not on file  . Stress: Not on file  Relationships  . Social connections:    Talks on phone: Not on file    Gets together: Not on file    Attends religious service: Not on file    Active member of club or  organization: Not on file    Attends meetings of clubs or organizations: Not on file    Relationship status: Not on file  Other Topics Concern  . Not on file  Social History Narrative   Mgm dm cad ?af on coumadin   ? If pgf undiagnosed depression   Paternal great aunt   Mom on wellbutrin and effexor for mom   ? 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    Outpatient Medications Prior to Visit  Medication Sig Dispense Refill  . typhoid (VIVOTIF) DR capsule Take 1 capsule by mouth every other day. To be completed pre travel 4 capsule 0  . DULoxetine (CYMBALTA) 60 MG capsule Take 1 capsule (60 mg total) by mouth 2 (two) times daily. 10 months of medication patient out of country  March to December 2020 600 capsule 0   No facility-administered medications prior to visit.      EXAM:  BP 120/72 (BP Location: Right  Arm, Patient Position: Sitting, Cuff Size: Normal)   Pulse 88   Temp 98.9 F (37.2 C) (Oral)   Wt 158 lb (71.7 kg)   BMI 25.12 kg/m   Body mass index is 25.12 kg/m.  GENERAL: vitals reviewed and listed above, alert, oriented, appears well hydrated and in no acute distress HEENT: atraumatic, conjunctiva  clear, no obvious abnormalities on inspection of external nose and ears MS: moves all extremities without noticeable focal  abnormality PSYCH: pleasant and cooperative, no obvious depression or anxiety Lab Results  Component Value Date   WBC 7.9 01/23/2016   HGB 12.4 01/23/2016   HCT 37.7 01/23/2016   PLT 509.0 (H) 01/23/2016   GLUCOSE 85 01/23/2016   CHOL 155 07/05/2013   TRIG 125.0 07/05/2013   HDL 50.90 07/05/2013   LDLCALC 79 07/05/2013   ALT 16 01/23/2016   AST 16 01/23/2016   NA 138 01/23/2016   K 4.5 01/23/2016   CL 106 01/23/2016   CREATININE 0.64 01/23/2016   BUN 13 01/23/2016   CO2 27 01/23/2016   TSH 0.75 01/23/2016   HGBA1C 5.7 01/23/2016   BP Readings from Last 3  Encounters:  02/09/18 120/72  01/30/18 116/62  01/23/16 98/68    ASSESSMENT AND PLAN:  Discussed the following assessment and plan:  Medication management  Need for influenza vaccination - Plan: Flu Vaccine QUAD 6+ mos PF IM (Fluarix Quad PF)  Need for Tdap vaccination - Plan: Tdap vaccine greater than or equal to 7yo IM  Rash recurrent hx   - ok to rx bactroban as per hx   Depression, major, single episode, complete remission (HCC)  Immunization due Record review is actually up-to-date on hepatitis A but needs flu vaccine and it appears updated tetanus shot Tdap last one was 2008. Form completed letter written okay for travel prescriptions refilled.  Has had yellow fever vaccine in past  Form and requests to be sent ot scan -Patient advised to return or notify health care team  if  new concerns arise.  Patient Instructions   Flu vaccine and tdap today .   Meds  sent to your pharmacy .   Have a great experience      Florine Sprenkle K. Zackery Brine M.D.

## 2018-02-14 ENCOUNTER — Other Ambulatory Visit: Payer: Self-pay | Admitting: Internal Medicine

## 2018-02-20 ENCOUNTER — Other Ambulatory Visit: Payer: Self-pay

## 2018-02-20 NOTE — Telephone Encounter (Signed)
Pt is requesting a year supply please advise  Due to being gone from Feb:27 -Dec 10th  Last Ov:02/09/2018 Last filled :02/16/2018

## 2018-02-24 NOTE — Telephone Encounter (Signed)
I have ordered  rx alreay on e on Jan 6 for 600 and one on jan 16 for 1 year   Please advise since  This is another request that  Should have already been placed

## 2018-03-08 DIAGNOSIS — D224 Melanocytic nevi of scalp and neck: Secondary | ICD-10-CM | POA: Diagnosis not present

## 2018-03-08 DIAGNOSIS — L0889 Other specified local infections of the skin and subcutaneous tissue: Secondary | ICD-10-CM | POA: Diagnosis not present

## 2018-03-08 DIAGNOSIS — D2261 Melanocytic nevi of right upper limb, including shoulder: Secondary | ICD-10-CM | POA: Diagnosis not present

## 2018-03-08 DIAGNOSIS — D2262 Melanocytic nevi of left upper limb, including shoulder: Secondary | ICD-10-CM | POA: Diagnosis not present

## 2018-03-08 DIAGNOSIS — L0109 Other impetigo: Secondary | ICD-10-CM | POA: Diagnosis not present

## 2018-03-08 DIAGNOSIS — D2221 Melanocytic nevi of right ear and external auricular canal: Secondary | ICD-10-CM | POA: Diagnosis not present

## 2018-04-14 ENCOUNTER — Ambulatory Visit: Payer: Self-pay | Admitting: Internal Medicine

## 2018-04-14 NOTE — Telephone Encounter (Signed)
I've been traveling..    5 days ago I had fever for 2-3 days.   No body aches or chills.     Wet cough.   Had for one and 1/2 weeks.   No shortness of breath.   No coronavirus exposure that I'm aware of.  I've been traveling over seas.   Been in 10 airports.    I've been in [place], [place].   These areas are not on the list now for high incidence of the coronavirus.    I instructed her to self quarantine for 14 days.   Wash hands frequently, don't touch face, etc.   Check temperature twice a day.   She needs to get a thermometer.   I encouraged her to get someone to get a thermometer for her.  I went over the symptoms for her to watch for and call us back.   She verbalized understanding and was agreeable to this plan.      Reason for Disposition . [1] Caller concerned that exposure to COVID-19 occurred BUT [2] does not meet COVID-19 EXPOSURE criteria from [facility]  Answer Assessment - Initial Assessment Questions 1. CONFIRMED CASE: "Who is the person with the confirmed COVID-19 infection that you were exposed to?"     None that I know of.   I've been traveling overseas and been in 10 airports in [place] and [place]. 2. PLACE of CONTACT: "Where were you when you were exposed to COVID-19  (coronavirus disease 2019)?" (e.g., city, state, country)     I've not been exposed to my knowledge.   3. TYPE of CONTACT: "How much contact was there?" (e.g., live in same house, work in same office, same school)     None aware of 4. DATE of CONTACT: "When did you have contact with a coronavirus patient?" (e.g., days)     N/A 5. DURATION of CONTACT: "How long were you in contact with the COVID-19 (coronavirus disease) patient?" (e.g., a few seconds, passed by person, a few minutes, live with the patient)     N/A 6. SYMPTOMS: "Do you have any symptoms?" (e.g., fever, cough, breathing difficulty)     5 days ago I had a fever for 2-3 days.   It's gone now.  I have a "wet cough" that I've had for 1 and 1/2 weeks".    Denies  shortness of breath.   7. PREGNANCY OR POSTPARTUM: "Is there any chance you are pregnant?" "When was your last menstrual period?" "Did you deliver in the last 2 weeks?"     Not asked  8. HIGH RISK: "Do you have any heart or lung problems? Do you have a weakened immune system?" (e.g., CHF, COPD, asthma, HIV positive, chemotherapy, renal failure, diabetes mellitus, sickle cell anemia)     No.    I was just wondering since I had been traveling if I needed to be tested.   Just in case.  Protocols used: CORONAVIRUS (COVID-19) EXPOSURE-A-AH

## 2018-08-01 ENCOUNTER — Ambulatory Visit: Payer: Self-pay | Admitting: Internal Medicine

## 2018-08-01 DIAGNOSIS — W540XXA Bitten by dog, initial encounter: Secondary | ICD-10-CM | POA: Diagnosis not present

## 2018-08-01 DIAGNOSIS — Z1159 Encounter for screening for other viral diseases: Secondary | ICD-10-CM | POA: Diagnosis not present

## 2018-08-01 DIAGNOSIS — L03211 Cellulitis of face: Secondary | ICD-10-CM | POA: Diagnosis not present

## 2018-08-01 NOTE — Telephone Encounter (Signed)
Clinical call placed, called pt back, pt presently at [facility]. States her dog bit her lip last night. States she decided to go to [facility] in case she needed sutures. Assured [place] would route to practice to alert Dr. Regis Bill.   Reason for Disposition . [1] Puncture wound or small cut AND [2] on face  Protocols used: ANIMAL BITE-A-AH

## 2018-08-16 DIAGNOSIS — Z111 Encounter for screening for respiratory tuberculosis: Secondary | ICD-10-CM | POA: Diagnosis not present

## 2018-08-19 DIAGNOSIS — Z111 Encounter for screening for respiratory tuberculosis: Secondary | ICD-10-CM | POA: Diagnosis not present

## 2018-12-18 DIAGNOSIS — Z20828 Contact with and (suspected) exposure to other viral communicable diseases: Secondary | ICD-10-CM | POA: Diagnosis not present

## 2019-01-20 DIAGNOSIS — Z03818 Encounter for observation for suspected exposure to other biological agents ruled out: Secondary | ICD-10-CM | POA: Diagnosis not present

## 2019-03-08 ENCOUNTER — Telehealth: Payer: Self-pay | Admitting: Internal Medicine

## 2019-03-08 NOTE — Telephone Encounter (Signed)
Medication Refill: Generic Cymbalta  Pharmacy: [facility] FAX: [phone]   Pharmacy called pt is out

## 2019-03-09 NOTE — Telephone Encounter (Signed)
Lvm pt needs an appt for this to be filled can be virtual appt

## 2019-03-10 ENCOUNTER — Other Ambulatory Visit: Payer: Self-pay | Admitting: Internal Medicine

## 2019-03-12 ENCOUNTER — Other Ambulatory Visit: Payer: Self-pay

## 2019-03-12 ENCOUNTER — Encounter: Payer: Self-pay | Admitting: Internal Medicine

## 2019-03-12 ENCOUNTER — Telehealth (INDEPENDENT_AMBULATORY_CARE_PROVIDER_SITE_OTHER): Payer: BC Managed Care – PPO | Admitting: Internal Medicine

## 2019-03-12 DIAGNOSIS — L089 Local infection of the skin and subcutaneous tissue, unspecified: Secondary | ICD-10-CM | POA: Diagnosis not present

## 2019-03-12 DIAGNOSIS — F325 Major depressive disorder, single episode, in full remission: Secondary | ICD-10-CM | POA: Diagnosis not present

## 2019-03-12 DIAGNOSIS — Z79899 Other long term (current) drug therapy: Secondary | ICD-10-CM

## 2019-03-12 MED ORDER — DULOXETINE HCL 60 MG PO CPEP: 120.0000 mg | ORAL_CAPSULE | Freq: Every day | ORAL | 1 refills | Status: DC

## 2019-03-12 MED ORDER — MUPIROCIN CALCIUM 2 % EX CREA: 1.0000 "application " | TOPICAL_CREAM | Freq: Three times a day (TID) | CUTANEOUS | 2 refills | Status: DC

## 2019-03-12 NOTE — Progress Notes (Signed)
Virtual Visit via Video Note  I connected with@ on 03/12/19 at  9:00 AM EST by a video enabled telemedicine application and verified that I am speaking with the correct person using two identifiers. Location patient: home Location provider:work  office Persons participating in the virtual visit: patient, provider  WIth national recommendations  regarding COVID 19 pandemic   video visit is advised over in office visit for this patient.  Patient aware  of the limitations of evaluation and management by telemedicine and  availability of in person appointments. and agreed to proceed.   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   Tad: no tob 2-3 x per week etoh maybe  Sleep 8 hours  Tries to get some etoh    No major change in health  Has had pap at some point  ROS: See pertinent positives and negatives per HPI.  Past Medical History:  Diagnosis Date  . Anxiety 10/11   Hospital Ingestion benadryl  . Concussion 6/11  . Depression 10/2009   Hospital ingestion benadryl  . DRUG OVERDOSE 10/27/2009   Qualifier: Diagnosis of  By: Regis Bill MD, Standley Brooking   . Fractured nose 6/11  . LLL pneumonia 11 /12  . Wheezing-associated respiratory infection (WARI) 03/06/2011    Past Surgical History:  Procedure Laterality Date  . Nose fracture surgery      History reviewed. No pertinent family history.  Social History   Tobacco Use  . Smoking  status: Never Smoker  . Smokeless tobacco: Never Used  Substance Use Topics  . Alcohol use: Yes    Alcohol/week: 4.0 standard drinks    Types: 4 Cans of beer per week    Comment: social  . Drug use: No      Current Outpatient Medications:  .  DULoxetine (CYMBALTA) 60 MG capsule, Take 2 capsules (120 mg total) by mouth daily., Disp: 180 capsule, Rfl: 1 .  mupirocin cream (BACTROBAN) 2 %, Apply 1 application topically 3 (three) times daily. For skin infection, Disp: 30 g, Rfl: 2  EXAM: BP Readings from Last 3 Encounters:  02/09/18 120/72  01/30/18 116/62  01/23/16 98/68    VITals   upat visits borderline  But ok in EHR  BP Readings from Last 3 Encounters:  02/09/18 120/72  01/30/18 116/62  01/23/16 98/68     GENERAL: alert, oriented, appears well and in no acute distress  HEENT: atraumatic, conjunttiva clear, no obvious abnormalities on inspection of external nose and ears  NECK: normal movements of the head and neck  LUNGS: on inspection no signs of respiratory distress, breathing rate appears normal, no obvious gross SOB, gasping or wheezing  CV: no obvious cyanosis  PSYCH/NEURO: pleasant and cooperative, no obvious depression or anxiety, speech and thought processing grossly intact   ASSESSMENT AND PLAN:  Discussed the following assessment and plan:  ICD-10-CM   1. Medication management  Z79.899   2. Depression, major, single episode, complete remission ([place])  F32.5   3. Superficial skin infection recurrent face   L08.9   Benefit more than risk of medications  to continue. Plan cpx before leaves for grad schooll or  6 months est   . At this time no change in meds  consider get back in counseling if  Appropriate. Cont  Adaptic lifestyle  Counseled.   Expectant management and discussion of plan and treatment with opportunity to ask questions and all were answered. The patient agreed with the plan and demonstrated an understanding of the instructions.    Advised to call back or seek an in-person evaluation if worsening  or having  further concerns . Return for preventive /cpx and medications in 4-6 months  or as needed . Outside external source  DATA REVIEWED:   ehr  Med management  Total time on date  of service including record review ordering and plan of care:   30 minutes   Shanon Ace, MD

## 2019-03-16 DIAGNOSIS — H6011 Cellulitis of right external ear: Secondary | ICD-10-CM | POA: Diagnosis not present

## 2019-03-16 DIAGNOSIS — H6981 Other specified disorders of Eustachian tube, right ear: Secondary | ICD-10-CM | POA: Diagnosis not present

## 2019-03-19 DIAGNOSIS — F419 Anxiety disorder, unspecified: Secondary | ICD-10-CM | POA: Diagnosis not present

## 2019-03-26 DIAGNOSIS — F419 Anxiety disorder, unspecified: Secondary | ICD-10-CM | POA: Diagnosis not present

## 2019-04-04 DIAGNOSIS — F419 Anxiety disorder, unspecified: Secondary | ICD-10-CM | POA: Diagnosis not present

## 2019-04-16 DIAGNOSIS — F419 Anxiety disorder, unspecified: Secondary | ICD-10-CM | POA: Diagnosis not present

## 2019-05-07 ENCOUNTER — Ambulatory Visit: Payer: 59 | Admitting: Internal Medicine

## 2019-05-07 NOTE — Progress Notes (Deleted)
This visit occurred during the SARS-CoV-2 public health emergency.  Safety protocols were in place, including screening questions prior to the visit, additional usage of staff PPE, and extensive cleaning of exam room while observing appropriate contact time as indicated for disinfecting solutions.    No chief complaint on file.   HPI: Pamela [name] 24 y.o. come in for ROS: See pertinent positives and negatives per HPI.  Past Medical History:  Diagnosis Date  . Anxiety 10/11   Hospital Ingestion benadryl  . Concussion 6/11  . Depression 10/2009   Hospital ingestion benadryl  . DRUG OVERDOSE 10/27/2009   Qualifier: Diagnosis of  By: Fabian Sharp MD, Neta Mends   . Fractured nose 6/11  . LLL pneumonia 11 /12  . Wheezing-associated respiratory infection (WARI) 03/06/2011    No family history on file.  Social History   Socioeconomic History  . Marital status: Single    Spouse name: Not on file  . Number of children: Not on file  . Years of education: Not on file  . Highest education level: Not on file  Occupational History  . Not on file  Tobacco Use  . Smoking status: Never Smoker  . Smokeless tobacco: Never Used  Substance and Sexual Activity  . Alcohol use: Yes    Alcohol/week: 4.0 standard drinks    Types: 4 Cans of beer per week    Comment: social  . Drug use: No  . Sexual activity: Not Currently  Other Topics Concern  . Not on file  Social History Narrative   Mgm dm cad ?af on coumadin   ? If pgf undiagnosed depression   Paternal great aunt   Mom on wellbutrin and effexor for mom   ? 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   Social Determinants of Health   Financial Resource Strain:   . Difficulty of Paying Living Expenses:   Food Insecurity:   . Worried About Programme researcher, broadcasting/film/video in the Last Year:   . Barista in the Last Year:   Transportation Needs:   . Freight forwarder  (Medical):   Marland Kitchen Lack of Transportation (Non-Medical):   Physical Activity:   . Days of Exercise per Week:   . Minutes of Exercise per Session:   Stress:   . Feeling of Stress :   Social Connections:   . Frequency of Communication with Friends and Family:   . Frequency of Social Gatherings with Friends and Family:   . Attends Religious Services:   . Active Member of Clubs or Organizations:   . Attends Banker Meetings:   Marland Kitchen Marital Status:     Outpatient Medications Prior to Visit  Medication Sig Dispense Refill  . DULoxetine (CYMBALTA) 60 MG capsule Take 2 capsules (120 mg total) by mouth daily. 180 capsule 1  . mupirocin cream (BACTROBAN) 2 % Apply 1 application topically 3 (three) times daily. For skin infection 30 g 2   No facility-administered medications prior to visit.     EXAM:  There were no vitals taken for this visit.  There is no height or weight on file to calculate BMI.  GENERAL: vitals reviewed and listed above, alert, oriented, appears well hydrated and in no acute distress HEENT: atraumatic, conjunctiva  clear, no obvious abnormalities on inspection of external nose and ears OP :  no lesion edema or exudate  NECK: no obvious masses on inspection palpation  LUNGS: clear to auscultation bilaterally, no wheezes, rales or rhonchi, good air movement CV: HRRR, no clubbing cyanosis or  peripheral edema nl cap refill  MS: moves all extremities without noticeable focal  abnormality PSYCH: pleasant and cooperative, no obvious depression or anxiety Lab Results  Component Value Date   WBC 7.9 01/23/2016   HGB 12.4 01/23/2016   HCT 37.7 01/23/2016   PLT 509.0 (H) 01/23/2016   GLUCOSE 85 01/23/2016   CHOL 155 07/05/2013   TRIG 125.0 07/05/2013   HDL 50.90 07/05/2013   LDLCALC 79 07/05/2013   ALT 16 01/23/2016   AST 16 01/23/2016   NA 138 01/23/2016   K 4.5 01/23/2016   CL 106 01/23/2016   CREATININE 0.64 01/23/2016   BUN 13 01/23/2016   CO2 27  01/23/2016   TSH 0.75 01/23/2016   HGBA1C 5.7 01/23/2016   BP Readings from Last 3 Encounters:  02/09/18 120/72  01/30/18 116/62  01/23/16 98/68    ASSESSMENT AND PLAN:  Discussed the following assessment and plan:  No diagnosis found.  -Patient advised to return or notify health care team  if  new concerns arise.  There are no Patient Instructions on file for this visit.   Standley Brooking. Emmilee Reamer M.D.

## 2019-06-23 ENCOUNTER — Other Ambulatory Visit: Payer: Self-pay | Admitting: Internal Medicine

## 2019-06-26 ENCOUNTER — Other Ambulatory Visit: Payer: Self-pay | Admitting: Internal Medicine

## 2019-11-02 ENCOUNTER — Other Ambulatory Visit: Payer: Self-pay | Admitting: Internal Medicine

## 2019-11-08 ENCOUNTER — Telehealth: Payer: Self-pay | Admitting: Internal Medicine

## 2019-11-08 ENCOUNTER — Other Ambulatory Visit: Payer: Self-pay | Admitting: Internal Medicine

## 2019-11-08 NOTE — Telephone Encounter (Signed)
Patient called pharmacy and requested a refill on Duloxetine.  She wants to make sure we got the pharmacy request because she is out of this medication.  She thought she had another bottle but she doesn't so accidentally ran out.

## 2019-11-08 NOTE — Telephone Encounter (Signed)
Spoke with the pt and informed her the Rx was previously sent to [facility] on 10/10.

## 2019-11-12 NOTE — Telephone Encounter (Signed)
See last ov was supposed to have a fu in person in summer    Will refill 30 days  Make her appt  Video ok   Before runs out  oof med

## 2019-11-30 DIAGNOSIS — Z20822 Contact with and (suspected) exposure to covid-19: Secondary | ICD-10-CM | POA: Diagnosis not present

## 2019-12-06 ENCOUNTER — Other Ambulatory Visit: Payer: Self-pay | Admitting: Internal Medicine

## 2019-12-06 NOTE — Telephone Encounter (Signed)
Last OV 03/12/19 Last refill 11/12/19 #60/0 Next OV not scheduled

## 2019-12-19 ENCOUNTER — Telehealth: Payer: Self-pay | Admitting: Internal Medicine

## 2019-12-19 NOTE — Telephone Encounter (Signed)
Ok to do a referral but I dont know  Psychiatry   [place] of [place] .  Assume dx is  Depression and medication

## 2019-12-19 NOTE — Telephone Encounter (Signed)
Left message to return call. Needing more information on what referral is needed and why.

## 2019-12-19 NOTE — Telephone Encounter (Signed)
Pt call and want a referral and want a call back.

## 2019-12-19 NOTE — Telephone Encounter (Signed)
Please advise 

## 2019-12-19 NOTE — Telephone Encounter (Signed)
Pt is calling back stating that she needs a referral to a psychiatrist and her insurance is requiring her to have one in order for her to make an appointment.  Pt state that she is in [place] in school and would like to see if we can do the referral to somewhere there if she gets the information and give it to [place]. And she will be back her at christmas time to visit with her parents.  Pt is aware that we do not do across the state referrals but she wanted to ask.

## 2019-12-19 NOTE — Telephone Encounter (Signed)
Left message to return call. Needing to know if patient wants to see someone in [place] or if she will be coming back here

## 2019-12-24 NOTE — Telephone Encounter (Signed)
Called lvm for patient to call office to let us know what office she wants referral to go to

## 2020-02-02 ENCOUNTER — Other Ambulatory Visit: Payer: Self-pay | Admitting: Internal Medicine

## 2020-02-28 ENCOUNTER — Other Ambulatory Visit: Payer: Self-pay | Admitting: Internal Medicine

## 2020-03-25 ENCOUNTER — Other Ambulatory Visit: Payer: Self-pay | Admitting: Internal Medicine

## 2020-03-27 ENCOUNTER — Other Ambulatory Visit: Payer: Self-pay

## 2020-03-27 ENCOUNTER — Telehealth: Payer: Self-pay | Admitting: Internal Medicine

## 2020-03-27 MED ORDER — DULOXETINE HCL 60 MG PO CPEP: ORAL_CAPSULE | ORAL | 0 refills | Status: DC

## 2020-03-27 NOTE — Telephone Encounter (Signed)
Patient is requesting a refill on Cymbalta 60 mg.  Patient is requesting a 60 or 90 day supply.   Pharmacy: [facility] on [address] in [place].

## 2020-03-27 NOTE — Telephone Encounter (Signed)
Patient is currently in [place], my chart visit scheduled for 04/10/20 @10am .   Thirty day supply sent to [facility] pharmacy in [facility]

## 2020-04-02 ENCOUNTER — Other Ambulatory Visit: Payer: Self-pay | Admitting: Internal Medicine

## 2020-04-10 ENCOUNTER — Telehealth (INDEPENDENT_AMBULATORY_CARE_PROVIDER_SITE_OTHER): Payer: Self-pay | Admitting: Internal Medicine

## 2020-04-10 ENCOUNTER — Other Ambulatory Visit: Payer: Self-pay

## 2020-04-10 ENCOUNTER — Encounter: Payer: Self-pay | Admitting: Internal Medicine

## 2020-04-10 DIAGNOSIS — Z79899 Other long term (current) drug therapy: Secondary | ICD-10-CM

## 2020-04-10 DIAGNOSIS — R41840 Attention and concentration deficit: Secondary | ICD-10-CM

## 2020-04-10 DIAGNOSIS — F324 Major depressive disorder, single episode, in partial remission: Secondary | ICD-10-CM

## 2020-04-10 MED ORDER — DULOXETINE HCL 60 MG PO CPEP
ORAL_CAPSULE | ORAL | 1 refills | Status: DC
Start: 2020-04-10 — End: 2020-09-02

## 2020-04-10 NOTE — Progress Notes (Signed)
Virtual Visit via Telephone Note  I connected with@ on 04/10/20 at 10:00 AM EDT by telephone and verified that I am speaking with the correct person using two identifiers.   I discussed the limitations, risks, security and privacy concerns of performing an evaluation and management service by telephone and the limited availability of in person appointments. tThere may be a patient responsible charge related to this service. The patient expressed understanding and agreed to proceed.  Location patient: home Location provider: home office Participants present for the call: patient, provider Patient did not have a visit in the prior 7 days to address this/these issue(s). Patient attempted to do video visit and was unable to get onto my chart so this was transitioned to a phone visit.  She was last seen on a telemedicine visit February 2021 for medicines.    History of Present Illness: Pamela [name] presents as above requesting medication refill last seen on a telemedicine visit in February 2021.  She is in [place].  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  She is doing okay has seen a counselor 2 times recently states that her mood is up and down but she never gets stuck in it more than a couple weeks not suicidal somewhat stressed There is some question if she could have attentional deficit and her counselor has given her some information for areas to reassess. Sleeping okay cutting down on any alcohol negative TAD goes to the gym has not had a checkup in a while probably due for Pap smear but not on birth control.  No major changes in health otherwise. Comes home only for a brief times in the summer and at holidays. Last plan was to come in person for a physical last summer but this did not happen.       Observations/Objective: Patient sounds personable and well on the phone. I do not appreciate any SOB. Speech  and thought processing are grossly intact. Patient reported vitals: Lab Results  Component Value Date   WBC 7.9 01/23/2016   HGB 12.4 01/23/2016   HCT 37.7 01/23/2016   PLT 509.0 (H) 01/23/2016   GLUCOSE 85 01/23/2016   CHOL 155 07/05/2013   TRIG 125.0 07/05/2013   HDL 50.90 07/05/2013   LDLCALC 79 07/05/2013   ALT 16 01/23/2016   AST 16 01/23/2016   NA 138 01/23/2016   K 4.5 01/23/2016   CL 106 01/23/2016   CREATININE 0.64 01/23/2016   BUN 13 01/23/2016   CO2 27 01/23/2016   TSH 0.75 01/23/2016   HGBA1C 5.7 01/23/2016    Assessment and Plan:  We will refill medication at this time for the next 6 months.  In her best interest would plan an in person visit if continuing care but will help with medicine transition.  Possible but okay to transition care in [place] but she will be there another year or so. Agree with staying with counselor and reaching out to evaluation for ADD whether a prescribing clinician specialist or even psychiatrist as indicated. Can also use her health services for a well woman check. Refill medication for the next 6 months and then she will touch base with [place] about plans going forward. Reviewed reaching out to her local help and family if things are going well.  She agrees with plan.  Follow Up Instructions:  6 mos  In person or visit  Contact  .   [id number] [id number] [id number] 11-20 [id number] 21-30 I did not refer this patient for an OV in the next 24 hours for this/these issue(s).  I discussed the assessment and treatment plan with the patient. The patient was provided an opportunity to ask questions and answered. The patient agreed with the plan and demonstrated an understanding of the instructions.   The patient was advised to call back or seek an in-person evaluationas indicated  I provided 22  minutes of non-face-to-face time during this encounter. Return in about 6 months (around 10/11/2020).  Berniece Andreas, MD

## 2020-08-06 DIAGNOSIS — F900 Attention-deficit hyperactivity disorder, predominantly inattentive type: Secondary | ICD-10-CM | POA: Diagnosis not present

## 2020-09-01 ENCOUNTER — Other Ambulatory Visit: Payer: Self-pay | Admitting: Internal Medicine

## 2020-09-22 ENCOUNTER — Telehealth: Payer: Self-pay | Admitting: Internal Medicine

## 2020-09-22 DIAGNOSIS — Z23 Encounter for immunization: Secondary | ICD-10-CM | POA: Diagnosis not present

## 2020-09-22 NOTE — Telephone Encounter (Signed)
Patient needs a chicken pox booster vaccine for school but it requires a prescription. Also needs a Hep B booster.     Good callback number is [phone]

## 2020-09-22 NOTE — Telephone Encounter (Signed)
I spoke with the pt and informed her that she would need to come into the office for visit in order to receive vaccinations. Pt stated that she is going out of town tomorrow and would need booster shots by today. I informed pt that we have no opening and the next available appt would be tomorrow. Pt decline appt.

## 2020-09-22 NOTE — Telephone Encounter (Signed)
Would need more information therefore a work in visit is okay Do not have her original vaccine records. Need to see the form to be able to assess for appropriate injections. Varicella vaccine is given in a series of 2 in adulthood so the request is not clear.

## 2020-09-22 NOTE — Telephone Encounter (Signed)
I spoke with the pt and she stated that she will be receiving vaccinations at [facility] and she does not need assistance from PCP.

## 2020-10-03 DIAGNOSIS — F900 Attention-deficit hyperactivity disorder, predominantly inattentive type: Secondary | ICD-10-CM | POA: Diagnosis not present

## 2020-10-07 DIAGNOSIS — F900 Attention-deficit hyperactivity disorder, predominantly inattentive type: Secondary | ICD-10-CM | POA: Diagnosis not present

## 2020-10-16 ENCOUNTER — Other Ambulatory Visit: Payer: Self-pay | Admitting: Internal Medicine

## 2020-11-04 DIAGNOSIS — L739 Follicular disorder, unspecified: Secondary | ICD-10-CM | POA: Diagnosis not present

## 2020-11-04 DIAGNOSIS — L705 Acne excoriee des jeunes filles: Secondary | ICD-10-CM | POA: Diagnosis not present

## 2020-11-04 DIAGNOSIS — D239 Other benign neoplasm of skin, unspecified: Secondary | ICD-10-CM | POA: Diagnosis not present

## 2020-11-27 DIAGNOSIS — D485 Neoplasm of uncertain behavior of skin: Secondary | ICD-10-CM | POA: Diagnosis not present

## 2020-11-27 DIAGNOSIS — D2372 Other benign neoplasm of skin of left lower limb, including hip: Secondary | ICD-10-CM | POA: Diagnosis not present

## 2021-01-12 ENCOUNTER — Other Ambulatory Visit: Payer: Self-pay | Admitting: Internal Medicine

## 2021-01-28 ENCOUNTER — Ambulatory Visit: Payer: BC Managed Care – PPO | Admitting: Internal Medicine

## 2021-01-28 ENCOUNTER — Encounter: Payer: Self-pay | Admitting: Internal Medicine

## 2021-01-30 DIAGNOSIS — Z111 Encounter for screening for respiratory tuberculosis: Secondary | ICD-10-CM | POA: Diagnosis not present

## 2021-02-02 DIAGNOSIS — Z111 Encounter for screening for respiratory tuberculosis: Secondary | ICD-10-CM | POA: Diagnosis not present

## 2021-02-03 ENCOUNTER — Ambulatory Visit (INDEPENDENT_AMBULATORY_CARE_PROVIDER_SITE_OTHER): Payer: BC Managed Care – PPO | Admitting: Internal Medicine

## 2021-02-03 VITALS — BP 110/70 | HR 111 | Temp 98.9°F | Ht 66.14 in | Wt 163.4 lb

## 2021-02-03 DIAGNOSIS — Z79899 Other long term (current) drug therapy: Secondary | ICD-10-CM

## 2021-02-03 DIAGNOSIS — F325 Major depressive disorder, single episode, in full remission: Secondary | ICD-10-CM

## 2021-02-03 DIAGNOSIS — F900 Attention-deficit hyperactivity disorder, predominantly inattentive type: Secondary | ICD-10-CM

## 2021-02-03 MED ORDER — METHYLPHENIDATE HCL ER (OSM) 18 MG PO TBCR: 18.0000 mg | EXTENDED_RELEASE_TABLET | Freq: Every day | ORAL | 0 refills | Status: DC

## 2021-02-03 NOTE — Patient Instructions (Addendum)
Good to see you today .  Trial low dose stimulant   may benefit from  higher dose or different  type of med.   Take in am to avoid  interfering with sleep . This is controlled substance so lock  up. And do not share.   Avoid substances that can interfere with memory or mood.

## 2021-02-03 NOTE — Progress Notes (Signed)
Chief Complaint  Patient presents with   ADHD    HPI: [place] F[place] 26 y.o. come in for review and evaluation for attentional difficulties. Depression is stable see PHQ-9. 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  She had had difficulty focusing attention losing things even at an early age.  She [facility] neuropsychological [facility] [place] [facility] [place] [place] the report is in the chart attached to the MyChart visit.  This was done on August 06, 2020 based on history and test performance and patterns diagnostic impression was ADHD predominantly inattentive presentation.  It was recommended to have environmental edges accommodations and behaviors to consult with PCP regarding appropriate nests of medication.  Negative T rare D and alcohol does regular exercise when not during testing time  There is some social anxiety but is doing well and hopeful and likes her studies.  See last  visit 3 2022  ROS: See pertinent positives and negatives per HPI. Periods  ok  Past Medical History:  Diagnosis Date   Anxiety 10/11   Hospital Ingestion benadryl   Concussion 6/11   Depression 10/2009   Hospital ingestion benadryl   DRUG OVERDOSE 10/27/2009   Qualifier: Diagnosis of  By: Fabian SharpPanosh MD, Neta MendsWanda K    Fractured nose 6/11   LLL pneumonia 11 /12   Wheezing-associated respiratory infection (WARI) 03/06/2011    History reviewed. No pertinent family history.  Social History   Socioeconomic History   Marital status: Single    Spouse name: Not on file   Number of children: Not on file   Years of education: Not on file   Highest education level: Master's degree (e.g., MA, MS, MEng, MEd, MSW, MBA)  Occupational History    Not on file  Tobacco Use   Smoking status: Never   Smokeless tobacco: Never  Substance and Sexual Activity   Alcohol use: Yes    Alcohol/week: 4.0 standard drinks    Types: 4 Cans of beer per week    Comment: social   Drug use: No   Sexual activity: Not Currently  Other Topics Concern   Not on file  Social History Narrative   Mgm dm cad ?af on coumadin   ? If pgf undiagnosed depression   Paternal great aunt   Mom on wellbutrin and effexor for mom   ? 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   Social Determinants of Health   Financial Resource Strain: Low Risk    Difficulty of Paying Living Expenses: Not hard at all  Food Insecurity: No Food Insecurity   Worried About Programme researcher, broadcasting/film/videounning Out of Food in the Last Year: Never true   Baristaan Out of Food in the Last Year: Never true  Transportation Needs: No Transportation Needs   Lack of Transportation (Medical): No   Lack of Transportation (Non-Medical): No  Physical Activity: Sufficiently Active   Days of Exercise per Week: 4 days   Minutes of Exercise per Session: 60 min  Stress: No Stress Concern Present   Feeling of Stress : Only a little  Social Connections: Socially Isolated   Frequency of  Communication with Friends and Family: More than three times a week   Frequency of Social Gatherings with Friends and Family: Twice a week   Attends Religious Services: Never   Database administrator or Organizations: No   Attends Engineer, structural: Not on file   Marital Status: Never married    Outpatient Medications Prior to Visit  Medication Sig Dispense Refill   DULoxetine (CYMBALTA) 60 MG capsule TAKE 2 CAPSULES BY MOUTH DAILY 60 capsule 1   mupirocin cream (BACTROBAN) 2 % Apply 1 application topically 3 (three) times daily. For skin infection (Patient not taking: Reported on 02/03/2021) 30 g 2   mupirocin ointment (BACTROBAN) 2 % APPLY TO THE AFFECTED  AREA THREE TIMES DAILY (Patient not taking: Reported on 02/03/2021) 22 g 0   No facility-administered medications prior to visit.     EXAM:  BP 110/70 (BP Location: Right Arm, Patient Position: Sitting, Cuff Size: Normal)    Pulse (!) 111    Temp 98.9 F (37.2 C) (Oral)    Ht 5' 6.14" (1.68 m)    Wt 163 lb 6.4 oz (74.1 kg)    SpO2 98%    BMI 26.26 kg/m   Body mass index is 26.26 kg/m.  GENERAL: vitals reviewed and listed above, alert, oriented, appears well hydrated and in no acute distress HEENT: atraumatic, conjunctiva  clear, no obvious abnormalities on inspection of external nose and ears OP : masked  NECK: no obvious masses on inspection palpation  LUNGS: clear to auscultation bilaterally, no wheezes, rales or rhonchi, good air movement CV: HRRR, no clubbing cyanosis or  peripheral edema nl cap refill  Abdomen:  Sof,t normal bowel sounds without hepatosplenomegaly, no guarding rebound or masses no CVA tenderness  MS: moves all extremities without noticeable focal  abnormality PSYCH: pleasant and cooperative, no obvious depression or anxiety Lab Results  Component Value Date   WBC 7.9 01/23/2016   HGB 12.4 01/23/2016   HCT 37.7 01/23/2016   PLT 509.0 (H) 01/23/2016   GLUCOSE 85 01/23/2016   CHOL 155 07/05/2013   TRIG 125.0 07/05/2013   HDL 50.90 07/05/2013   LDLCALC 79 07/05/2013   ALT 16 01/23/2016   AST 16 01/23/2016   NA 138 01/23/2016   K 4.5 01/23/2016   CL 106 01/23/2016   CREATININE 0.64 01/23/2016   BUN 13 01/23/2016   CO2 27 01/23/2016   TSH 0.75 01/23/2016   HGBA1C 5.7 01/23/2016   BP Readings from Last 3 Encounters:  02/03/21 110/70  02/09/18 120/72  01/30/18 116/62  Reviewed report from Dion Saucier PhD [facility] licensed psychologist  See [email or web address]  ASSESSMENT AND PLAN:  Discussed the following assessment and plan:  ADHD (attention deficit hyperactivity disorder), inattentive type - see neuropsych evaluation july 2022  Medication  management  Depression, major, single episode, complete remission (HCC) Past history of anxiety hospitalization and drug overdose benadry years ago and depression but appears  to be stable and controlled although  I agree with her tha in future she may benefit from psychiatric  management .  Risk benefit of medication discussed.   Will begin low dose  methylphenidate concerta  adjust dose  consider vyvanse   also  Continue other  advise for attention avoid  SU ( not a problem now)   Plan   contact 3-4 weeks  visit virtual ok 3 mos or less.   Check with pharmacy  rules about out of state  prescribing of schedule 2 meds  -Patient  advised to return or notify health care team  if  new concerns arise. Read 7 page report evaluate visit counsel on new medication follow-up side effects potential.  40 minutes Patient Instructions  Good to see you today .  Trial low dose stimulant   may benefit from  higher dose or different  type of med.   Take in am to avoid  interfering with sleep . This is controlled substance so lock  up. And do not share.   Avoid substances that can interfere with memory or mood.   Neta Mends. Kaelan Emami M.D.

## 2021-02-05 ENCOUNTER — Telehealth: Payer: BC Managed Care – PPO | Admitting: Internal Medicine

## 2021-03-08 ENCOUNTER — Other Ambulatory Visit: Payer: Self-pay | Admitting: Internal Medicine

## 2021-03-09 ENCOUNTER — Encounter: Payer: Self-pay | Admitting: Internal Medicine

## 2021-03-09 ENCOUNTER — Telehealth (INDEPENDENT_AMBULATORY_CARE_PROVIDER_SITE_OTHER): Payer: BC Managed Care – PPO | Admitting: Internal Medicine

## 2021-03-09 DIAGNOSIS — F325 Major depressive disorder, single episode, in full remission: Secondary | ICD-10-CM

## 2021-03-09 DIAGNOSIS — Z79899 Other long term (current) drug therapy: Secondary | ICD-10-CM

## 2021-03-09 DIAGNOSIS — F900 Attention-deficit hyperactivity disorder, predominantly inattentive type: Secondary | ICD-10-CM

## 2021-03-09 MED ORDER — LISDEXAMFETAMINE DIMESYLATE 20 MG PO CAPS: 20.0000 mg | ORAL_CAPSULE | Freq: Every day | ORAL | 0 refills | Status: DC

## 2021-03-09 NOTE — Progress Notes (Signed)
Virtual Visit via Video Note  I connected with Pamela [name] on 03/09/21 at  8:30 AM EST by a video enabled telemedicine application and verified that I am speaking with the correct person using two identifiers. Location patient: home Location provider:workoffice Persons participating in the virtual visit: patient, provider  [place] national recommendations  regarding COVID 19 pandemic   video visit is advised over in office visit for this patient.  Patient aware  of the limitations of evaluation and management by telemedicine and  availability of in person appointments. and agreed to proceed.   HPI: Pamela [name] presents for video visit in regard to beginning low-dose Concerta generic for attentional difficulties.  She states it did not really make that much difference that she noticed in her attention and focusing however she did have a couple days premenstrually where she cried spells for no obvious reason except it was before her.  She does get moody before her period.  She did not hold off on the medicine because she was not sure if it was the medicine causing it.  She is doing okay currently does not have an active counselor but has been in counseling before.  Continues with her schoolwork to graduate in May uncertain if going to stay in [place] or come back [place].    ROS: See pertinent positives and negatives per HPI.  Past Medical History:  Diagnosis Date   Anxiety 10/11   Hospital Ingestion benadryl   Concussion 6/11   Depression 10/2009   Hospital ingestion benadryl   DRUG OVERDOSE 10/27/2009   Qualifier: Diagnosis of  By: Fabian Sharp MD, Neta Mends    Fractured nose 6/11   LLL pneumonia 11 /12   Wheezing-associated respiratory infection (WARI) 03/06/2011    Past Surgical History:  Procedure Laterality Date   Nose fracture surgery      History reviewed. No pertinent family history.  Social History   Tobacco Use   Smoking status: Never   Smokeless tobacco: Never   Substance Use Topics   Alcohol use: Yes    Alcohol/week: 4.0 standard drinks    Types: 4 Cans of beer per week    Comment: social   Drug use: No      Current Outpatient Medications:    lisdexamfetamine (VYVANSE) 20 MG capsule, Take 1 capsule (20 mg total) by mouth daily., Disp: 30 capsule, Rfl: 0   DULoxetine (CYMBALTA) 60 MG capsule, TAKE 2 CAPSULES BY MOUTH DAILY, Disp: 60 capsule, Rfl: 1   mupirocin cream (BACTROBAN) 2 %, Apply 1 application topically 3 (three) times daily. For skin infection (Patient not taking: Reported on 02/03/2021), Disp: 30 g, Rfl: 2   mupirocin ointment (BACTROBAN) 2 %, APPLY TO THE AFFECTED AREA THREE TIMES DAILY (Patient not taking: Reported on 02/03/2021), Disp: 22 g, Rfl: 0  EXAM: BP Readings from Last 3 Encounters:  02/03/21 110/70  02/09/18 120/72  01/30/18 116/62    VITALS per patient if applicable:  GENERAL: alert, oriented, appears well and in no acute distress looks well  HEENT: atraumatic, conjunttiva clear, no obvious abnormalities on inspection of external nose and ears   PSYCH/NEURO: pleasant and cooperative, no obvious depression or anxiety, speech and thought processing grossly intact Lab Results  Component Value Date   WBC 7.9 01/23/2016   HGB 12.4 01/23/2016   HCT 37.7 01/23/2016   PLT 509.0 (H) 01/23/2016   GLUCOSE 85 01/23/2016   CHOL 155 07/05/2013   TRIG 125.0 07/05/2013   HDL 50.90 07/05/2013  LDLCALC 79 07/05/2013   ALT 16 01/23/2016   AST 16 01/23/2016   NA 138 01/23/2016   K 4.5 01/23/2016   CL 106 01/23/2016   CREATININE 0.64 01/23/2016   BUN 13 01/23/2016   CO2 27 01/23/2016   TSH 0.75 01/23/2016   HGBA1C 5.7 01/23/2016    ASSESSMENT AND PLAN:  Discussed the following assessment and plan:    ICD-10-CM   1. ADHD (attention deficit hyperactivity disorder), inattentive type  F90.0     2. Medication management  Z79.899    ? is se of med vs ineffective ness and incidental finding  pre menstrual crying      3. Depression, major, single episode, complete remission (HCC)  F32.5      Unknown certain if side effect of medicine stimulant medicine can cause mood changes however it seems to be more of an aggravation of PMS type symptoms options of increasing doses of Concerta or switching medicine altogether. Patient prefers change in medicine.  Discussed positive negatives monitoring of symptoms Also advised reestablishing with a counselor as will probably be helpful. Plan follow-up med check in 3 to 4 weeks or at least 2 weeks on new medicine. Prescribed Vyvanse 20 mg sent to the [facility] Mom will get the medicine sent to her.   We also discussed an option of trying 36 mg because she has about 10 of the 18 mg remaining of the Concerta although that might be too big of a jump but if got side effects would not maintain Counseled.   Expectant management and discussion of plan and treatment with opportunity to ask questions and all were answered. The patient agreed with the plan and demonstrated an understanding of the instructions.   Advised to call back or seek an in-person evaluation if worsening  or having  further concerns  in interim. Return for 3-4 weeks med check .    Berniece Andreas, MD

## 2021-04-03 ENCOUNTER — Telehealth: Payer: Self-pay

## 2021-04-03 ENCOUNTER — Other Ambulatory Visit: Payer: Self-pay

## 2021-04-03 NOTE — Telephone Encounter (Signed)
Last Vv 03/09/21 ?Filled 03/09/21 ?Is it ok to refill? ?

## 2021-04-05 MED ORDER — LISDEXAMFETAMINE DIMESYLATE 20 MG PO CAPS: 20.0000 mg | ORAL_CAPSULE | Freq: Every day | ORAL | 0 refills | Status: DC

## 2021-04-13 ENCOUNTER — Encounter: Payer: Self-pay | Admitting: Internal Medicine

## 2021-04-14 NOTE — Telephone Encounter (Signed)
So   Strattera is a daily build up medication and  not a stimulant but certainly ok to try . ?There is a shortage oi adderall  and sometimes seems to be more cause of moodiness than methylphenidate type meds   ? ?Please make a visit virtual ok to discuss  ? ?

## 2021-04-16 ENCOUNTER — Telehealth (INDEPENDENT_AMBULATORY_CARE_PROVIDER_SITE_OTHER): Payer: BC Managed Care – PPO | Admitting: Internal Medicine

## 2021-04-16 ENCOUNTER — Encounter: Payer: Self-pay | Admitting: Internal Medicine

## 2021-04-16 DIAGNOSIS — Z79899 Other long term (current) drug therapy: Secondary | ICD-10-CM

## 2021-04-16 DIAGNOSIS — F900 Attention-deficit hyperactivity disorder, predominantly inattentive type: Secondary | ICD-10-CM | POA: Diagnosis not present

## 2021-04-16 MED ORDER — METHYLPHENIDATE HCL ER (OSM) 27 MG PO TBCR: 27.0000 mg | EXTENDED_RELEASE_TABLET | ORAL | 0 refills | Status: DC

## 2021-04-16 NOTE — Progress Notes (Signed)
?Virtual Visit via Video Note ? ?I connected with Pamela [name] on 04/16/21 at  9:00 AM EDT by a video enabled telemedicine application and verified that I am speaking with the correct person using two identifiers. ?Location patient: vehicle  ?Location provider:home office ?Persons participating in the virtual visit: patient, provider ? ?[place] national recommendations  regarding COVID 19 pandemic   video visit is advised over in office visit for this patient.  ?Patient aware  of the limitations of evaluation and management by telemedicine and  availability of in person appointments. and agreed to proceed. ? ? ?HPI: ?Pamela [name] presents for video visit to discuss  medication for adhd   cost of medication vyvanse  is an issues she is back in town for spring break. ?See last notes  and phone message . Would consider trying  straterra   or  methylphenidate again   had pms sx but may not have been med   tried 2 and may have had gnotable concentration effect .    ? ?Has 5 weeks school  left and to come home after that and go from there.  ?No change in her medical condition otherwise. ?ROS: See pertinent positives and negatives per HPI. ? ?Past Medical History:  ?Diagnosis Date  ? Anxiety 10/11  ? Hospital Ingestion benadryl  ? Concussion 6/11  ? Depression 10/2009  ? Hospital ingestion benadryl  ? DRUG OVERDOSE 10/27/2009  ? Qualifier: Diagnosis of  By: Fabian Sharp MD, Neta Mends   ? Fractured nose 6/11  ? LLL pneumonia 11 /12  ? Wheezing-associated respiratory infection (WARI) 03/06/2011  ? ? ?Past Surgical History:  ?Procedure Laterality Date  ? Nose fracture surgery    ? ? ?No family history on file. ? ?Social History  ? ?Tobacco Use  ? Smoking status: Never  ? Smokeless tobacco: Never  ?Substance Use Topics  ? Alcohol use: Yes  ?  Alcohol/week: 4.0 standard drinks  ?  Types: 4 Cans of beer per week  ?  Comment: social  ? Drug use: No  ? ? ? ? ?Current Outpatient Medications:  ?  DULoxetine (CYMBALTA) 60 MG capsule, TAKE 2  CAPSULES BY MOUTH DAILY, Disp: 60 capsule, Rfl: 1 ?  methylphenidate (CONCERTA) 27 MG PO CR tablet, Take 1 tablet (27 mg total) by mouth every morning., Disp: 30 tablet, Rfl: 0 ?  mupirocin cream (BACTROBAN) 2 %, Apply 1 application topically 3 (three) times daily. For skin infection, Disp: 30 g, Rfl: 2 ?  mupirocin ointment (BACTROBAN) 2 %, APPLY TO THE AFFECTED AREA THREE TIMES DAILY, Disp: 22 g, Rfl: 0 ? ?EXAM: ?BP Readings from Last 3 Encounters:  ?02/03/21 110/70  ?02/09/18 120/72  ?01/30/18 116/62  ? ? ?VITALS per patient if applicable: ? ?GENERAL: alert, oriented, appears well and in no acute distress ? ?HEENT: atraumatic, conjunttiva clear, no obvious abnormalities on inspection of external nose and ears ? ?NECK: normal movements of the head and neck ? ?LUNGS: on inspection no signs of respiratory distress, breathing rate appears normal, no obvious gross SOB, gasping or wheezing ? ?CV: no obvious cyanosis ? ?MS: moves all visible extremities without noticeable abnormality ? ?PSYCH/NEURO: pleasant and cooperative, no obvious depression or anxiety, speech and thought processing grossly intact ?Lab Results  ?Component Value Date  ? WBC 7.9 01/23/2016  ? HGB 12.4 01/23/2016  ? HCT 37.7 01/23/2016  ? PLT 509.0 (H) 01/23/2016  ? GLUCOSE 85 01/23/2016  ? CHOL 155 07/05/2013  ? TRIG 125.0 07/05/2013  ?  HDL 50.90 07/05/2013  ? LDLCALC 79 07/05/2013  ? ALT 16 01/23/2016  ? AST 16 01/23/2016  ? NA 138 01/23/2016  ? K 4.5 01/23/2016  ? CL 106 01/23/2016  ? CREATININE 0.64 01/23/2016  ? BUN 13 01/23/2016  ? CO2 27 01/23/2016  ? TSH 0.75 01/23/2016  ? HGBA1C 5.7 01/23/2016  ? ? ?ASSESSMENT AND PLAN: ? ?Discussed the following assessment and plan: ? ?  ICD-10-CM   ?1. ADHD (attention deficit hyperactivity disorder), inattentive type  F90.0   ?  ?2. Medication management  Z79.899   ?  ? ?She feels would like to go back and try the long-acting methylphenidate as the moodiness PMS there were other factors involved and may not  of been related to the medicine at all. ?We will try 27 mg Concerta ER generic or brand what ever is less expensive and consider ramping up dose if appropriate. ? ?Let us know how she is doing in a month or when due for refill ?Plan visit in about 3 months or earlier if needed.  Preferably in person if she is back in [place]. ?Counseled.  ? Expectant management and discussion of plan and treatment with opportunity to ask questions and all were answered. The patient agreed with the plan and demonstrated an understanding of the instructions. ?  ?Advised to call back or seek an in-person evaluation if worsening  or having  further concerns  in interim. ?Return for 3 mos visit   med check , 1 month status update  and how doing(  visit if needed at that time). ? ?Berniece Andreas, MD  ?

## 2021-05-07 ENCOUNTER — Other Ambulatory Visit: Payer: Self-pay | Admitting: Internal Medicine

## 2021-05-07 NOTE — Telephone Encounter (Signed)
Last Vv 04/16/21 ?Filled 03/09/21 ?Is it ok to refill? ?

## 2021-05-08 ENCOUNTER — Encounter: Payer: Self-pay | Admitting: Internal Medicine

## 2021-05-11 MED ORDER — METHYLPHENIDATE HCL ER (OSM) 36 MG PO TBCR: 36.0000 mg | EXTENDED_RELEASE_TABLET | Freq: Every day | ORAL | 0 refills | Status: DC

## 2021-05-11 NOTE — Telephone Encounter (Signed)
Have sent in concerta 36 mg to  the [facility]  to try. ? ?Plan a fu visit or appt  before next refill .  ?Glad the medication is helping. ?

## 2021-05-20 MED ORDER — METHYLPHENIDATE HCL ER (OSM) 36 MG PO TBCR: 36.0000 mg | EXTENDED_RELEASE_TABLET | Freq: Every day | ORAL | 0 refills | Status: DC

## 2021-05-20 NOTE — Telephone Encounter (Signed)
I sent in again to pharmacy  ?Let us know if not received  ?

## 2021-05-22 DIAGNOSIS — H6503 Acute serous otitis media, bilateral: Secondary | ICD-10-CM | POA: Diagnosis not present

## 2021-05-22 DIAGNOSIS — J029 Acute pharyngitis, unspecified: Secondary | ICD-10-CM | POA: Diagnosis not present

## 2021-05-22 DIAGNOSIS — H938X1 Other specified disorders of right ear: Secondary | ICD-10-CM | POA: Diagnosis not present

## 2021-06-15 DIAGNOSIS — F331 Major depressive disorder, recurrent, moderate: Secondary | ICD-10-CM | POA: Diagnosis not present

## 2021-06-30 DIAGNOSIS — F331 Major depressive disorder, recurrent, moderate: Secondary | ICD-10-CM | POA: Diagnosis not present

## 2021-07-14 ENCOUNTER — Other Ambulatory Visit: Payer: Self-pay | Admitting: Internal Medicine

## 2021-07-14 DIAGNOSIS — F331 Major depressive disorder, recurrent, moderate: Secondary | ICD-10-CM | POA: Diagnosis not present

## 2021-07-22 DIAGNOSIS — F331 Major depressive disorder, recurrent, moderate: Secondary | ICD-10-CM | POA: Diagnosis not present

## 2021-08-12 DIAGNOSIS — F331 Major depressive disorder, recurrent, moderate: Secondary | ICD-10-CM | POA: Diagnosis not present

## 2021-08-19 ENCOUNTER — Telehealth (INDEPENDENT_AMBULATORY_CARE_PROVIDER_SITE_OTHER): Payer: BC Managed Care – PPO | Admitting: Internal Medicine

## 2021-08-19 ENCOUNTER — Encounter: Payer: Self-pay | Admitting: Internal Medicine

## 2021-08-19 DIAGNOSIS — Z79899 Other long term (current) drug therapy: Secondary | ICD-10-CM

## 2021-08-19 DIAGNOSIS — F900 Attention-deficit hyperactivity disorder, predominantly inattentive type: Secondary | ICD-10-CM

## 2021-08-19 DIAGNOSIS — F325 Major depressive disorder, single episode, in full remission: Secondary | ICD-10-CM

## 2021-08-19 MED ORDER — BUPROPION HCL ER (XL) 150 MG PO TB24: 150.0000 mg | ORAL_TABLET | Freq: Every day | ORAL | 1 refills | Status: DC

## 2021-08-19 MED ORDER — METHYLPHENIDATE HCL ER (OSM) 36 MG PO TBCR: 36.0000 mg | EXTENDED_RELEASE_TABLET | Freq: Every day | ORAL | 0 refills | Status: DC

## 2021-08-19 NOTE — Progress Notes (Signed)
Virtual Visit via Video Note  I connected with Pamela [name] on 08/19/21 at  3:00 PM EDT by a video enabled telemedicine application and verified that I am speaking with the correct person using two identifiers. Location patient: home Location provider:work office Persons participating in the virtual visit: patient, provider  Patient aware  of the limitations of evaluation and management by telemedicine and  availability of in person appointments. and agreed to proceed.   HPI: Pamela [name] presents for video visit for medicine evaluation  Back in [place] and  not yet taking the concerta 36 NA because it is not available at her pharmacy yet  but doing ok  not yet applying   for a job graduated in May.  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  Trying to find a psych for prescribing   but asking  if cn  in addition to cymbalta   ? Add on wellbutrin mother did well on this. She does feel depressed and down more often than not recently but not hopeless and suicidal. Has a new pharmacy request sending Concerta to the new pharmacy in case they have it. No other changes in medical health negative TAD sleep is okay continues to exercise is living at home at this time. She sees a Veterinary surgeon with talk therapy. ROS: See pertinent positives and negatives per HPI.  Past Medical History:  Diagnosis Date   Anxiety 10/11   Hospital Ingestion benadryl   Concussion 6/11   Depression 10/2009   Hospital ingestion benadryl   DRUG OVERDOSE 10/27/2009   Qualifier: Diagnosis of  By: Fabian Sharp MD, Neta Mends    Fractured nose 6/11   LLL pneumonia 11 /12   Wheezing-associated respiratory infection (WARI) 03/06/2011    Past Surgical History:  Procedure Laterality Date   Nose fracture surgery      History reviewed. No pertinent family history.  Social History   Tobacco Use   Smoking status: Never   Smokeless tobacco: Never  Substance Use Topics   Alcohol use: Yes     Alcohol/week: 4.0 standard drinks of alcohol    Types: 4 Cans of beer per week    Comment: social   Drug use: No      Current Outpatient Medications:    buPROPion (WELLBUTRIN XL) 150 MG 24 hr tablet, Take 1 tablet (150 mg total) by mouth daily., Disp: 30 tablet, Rfl: 1   DULoxetine (CYMBALTA) 60 MG capsule, TAKE 2 CAPSULES BY MOUTH DAILY, Disp: 60 capsule, Rfl: 1   methylphenidate (CONCERTA) 36 MG PO CR tablet, Take 1 tablet (36 mg total) by mouth daily., Disp: 30 tablet, Rfl: 0   mupirocin cream (BACTROBAN) 2 %, Apply 1 application topically 3 (three) times daily. For skin infection, Disp: 30 g, Rfl: 2   mupirocin ointment (BACTROBAN) 2 %, APPLY TO THE AFFECTED AREA THREE TIMES DAILY, Disp: 22 g, Rfl: 0  EXAM: BP Readings from Last 3 Encounters:  02/03/21 110/70  02/09/18 120/72  01/30/18 116/62    VITALS per patient if applicable:  GENERAL: alert, oriented, appears well and in no acute distress  HEENT: atraumatic, conjunttiva clear, no obvious abnormalities on inspection of external nose and ears  NECK: normal movements of the head and neck  LUNGS: on inspection no signs of respiratory distress, breathing rate appears normal, no obvious gross SOB, gasping or wheezing  CV: no obvious cyanosis  MS: moves all visible extremities without noticeable abnormality  PSYCH/NEURO: pleasant and  cooperative, no obvious depression or anxiety, speech and thought processing grossly intact Lab Results  Component Value Date   WBC 7.9 01/23/2016   HGB 12.4 01/23/2016   HCT 37.7 01/23/2016   PLT 509.0 (H) 01/23/2016   GLUCOSE 85 01/23/2016   CHOL 155 07/05/2013   TRIG 125.0 07/05/2013   HDL 50.90 07/05/2013   LDLCALC 79 07/05/2013   ALT 16 01/23/2016   AST 16 01/23/2016   NA 138 01/23/2016   K 4.5 01/23/2016   CL 106 01/23/2016   CREATININE 0.64 01/23/2016   BUN 13 01/23/2016   CO2 27 01/23/2016   TSH 0.75 01/23/2016   HGBA1C 5.7 01/23/2016    ASSESSMENT AND  PLAN:  Discussed the following assessment and plan:    ICD-10-CM   1. Medication management  Z79.899     2. Depression, major, single episode, complete remission (HCC)  F32.5     3. ADHD (attention deficit hyperactivity disorder), inattentive type  F90.0     Reasonable to add on Wellbutrin XR 150 augmentation therapy would still have her continue with therapy and continue to look for a specialist behavioral health prescriber. I suspect she has some confidence issues in transitioning from schooling to job.. We will refill the Concerta at her new pharmacy if available Plan 1 mos med check if not in  w specialist  [facility] prescriber  Counseled.   Expectant management and discussion of plan and treatment with opportunity to ask questions and all were answered. The patient agreed with the plan and demonstrated an understanding of the instructions.   Advised to call back or seek an in-person evaluation if worsening  or having  further concerns  in interim. Return in about 1 month (around 09/19/2021) for med check virtual ok .   Berniece Andreas, MD

## 2021-08-31 DIAGNOSIS — F331 Major depressive disorder, recurrent, moderate: Secondary | ICD-10-CM | POA: Diagnosis not present

## 2021-09-02 ENCOUNTER — Encounter: Payer: Self-pay | Admitting: Internal Medicine

## 2021-09-02 MED ORDER — METHYLPHENIDATE HCL ER (OSM) 36 MG PO TBCR: 36.0000 mg | EXTENDED_RELEASE_TABLET | Freq: Every day | ORAL | 0 refills | Status: DC

## 2021-09-02 NOTE — Telephone Encounter (Signed)
Sent in electronically .  To [facility]

## 2021-09-10 DIAGNOSIS — F331 Major depressive disorder, recurrent, moderate: Secondary | ICD-10-CM | POA: Diagnosis not present

## 2021-09-11 ENCOUNTER — Other Ambulatory Visit: Payer: Self-pay | Admitting: Internal Medicine

## 2021-09-14 ENCOUNTER — Encounter: Payer: Self-pay | Admitting: Internal Medicine

## 2021-09-14 NOTE — Telephone Encounter (Signed)
Refill  1 month worth  cymbalta  120 mg per day ( 2 x 60  mg)

## 2021-09-15 ENCOUNTER — Other Ambulatory Visit: Payer: Self-pay | Admitting: Internal Medicine

## 2021-09-15 MED ORDER — DULOXETINE HCL 60 MG PO CPEP: 120.0000 mg | ORAL_CAPSULE | Freq: Every day | ORAL | 0 refills | Status: DC

## 2021-10-04 NOTE — Progress Notes (Unsigned)
No chief complaint on file.   HPI: Pamela [name] 26 y.o. come in for Chronic disease management  ROS: See pertinent positives and negatives per HPI.  Past Medical History:  Diagnosis Date   Anxiety 10/11   Hospital Ingestion benadryl   Concussion 6/11   Depression 10/2009   Hospital ingestion benadryl   DRUG OVERDOSE 10/27/2009   Qualifier: Diagnosis of  By: Fabian Sharp MD, Neta Mends    Fractured nose 6/11   LLL pneumonia 11 /12   Wheezing-associated respiratory infection (WARI) 03/06/2011    No family history on file.  Social History   Socioeconomic History   Marital status: Single    Spouse name: Not on file   Number of children: Not on file   Years of education: Not on file   Highest education level: Master's degree (e.g., MA, MS, MEng, MEd, MSW, MBA)  Occupational History   Not on file  Tobacco Use   Smoking status: Never   Smokeless tobacco: Never  Substance and Sexual Activity   Alcohol use: Yes    Alcohol/week: 4.0 standard drinks of alcohol    Types: 4 Cans of beer per week    Comment: social   Drug use: No   Sexual activity: Not Currently  Other Topics Concern   Not on file  Social History Narrative   Mgm dm cad ?af on coumadin   ? If pgf undiagnosed depression   Paternal great aunt   Mom on wellbutrin and effexor for mom   ? 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   Social Determinants of Health   Financial Resource Strain: Low Risk  (02/03/2021)   Overall Financial Resource Strain (CARDIA)    Difficulty of Paying Living Expenses: Not hard at all  Food Insecurity: No Food Insecurity (02/03/2021)   Hunger Vital Sign    Worried About Running Out of Food in the Last Year: Never true    Ran Out of Food in the Last Year: Never true  Transportation Needs: No Transportation Needs (02/03/2021)   PRAPARE - Administrator, Civil Service (Medical): No    Lack of  Transportation (Non-Medical): No  Physical Activity: Sufficiently Active (02/03/2021)   Exercise Vital Sign    Days of Exercise per Week: 4 days    Minutes of Exercise per Session: 60 min  Stress: No Stress Concern Present (02/03/2021)   [employer] of Occupational Health - Occupational Stress Questionnaire    Feeling of Stress : Only a little  Social Connections: Socially Isolated (02/03/2021)   Social Connection and Isolation Panel [NHANES]    Frequency of Communication with Friends and Family: More than three times a week    Frequency of Social Gatherings with Friends and Family: Twice a week    Attends Religious Services: Never    Database administrator or Organizations: No    Attends Engineer, structural: Not on file    Marital Status: Never married    Outpatient Medications Prior to Visit  Medication Sig Dispense Refill   buPROPion (WELLBUTRIN XL) 150 MG 24 hr tablet TAKE 1 TABLET(150 MG) BY MOUTH DAILY 30 tablet 0   DULoxetine (CYMBALTA) 60 MG capsule Take 2 capsules (120 mg total) by mouth daily. 60 capsule 0   methylphenidate (CONCERTA) 36 MG PO CR tablet Take 1 tablet (  36 mg total) by mouth daily. 30 tablet 0   mupirocin cream (BACTROBAN) 2 % Apply 1 application topically 3 (three) times daily. For skin infection 30 g 2   mupirocin ointment (BACTROBAN) 2 % APPLY TO THE AFFECTED AREA THREE TIMES DAILY 22 g 0   No facility-administered medications prior to visit.     EXAM:  There were no vitals taken for this visit.  There is no height or weight on file to calculate BMI.  GENERAL: vitals reviewed and listed above, alert, oriented, appears well hydrated and in no acute distress HEENT: atraumatic, conjunctiva  clear, no obvious abnormalities on inspection of external nose and ears OP : no lesion edema or exudate  NECK: no obvious masses on inspection palpation  LUNGS: clear to auscultation bilaterally, no wheezes, rales or rhonchi, good air movement CV: HRRR,  no clubbing cyanosis or  peripheral edema nl cap refill  MS: moves all extremities without noticeable focal  abnormality PSYCH: pleasant and cooperative, no obvious depression or anxiety Lab Results  Component Value Date   WBC 7.9 01/23/2016   HGB 12.4 01/23/2016   HCT 37.7 01/23/2016   PLT 509.0 (H) 01/23/2016   GLUCOSE 85 01/23/2016   CHOL 155 07/05/2013   TRIG 125.0 07/05/2013   HDL 50.90 07/05/2013   LDLCALC 79 07/05/2013   ALT 16 01/23/2016   AST 16 01/23/2016   NA 138 01/23/2016   K 4.5 01/23/2016   CL 106 01/23/2016   CREATININE 0.64 01/23/2016   BUN 13 01/23/2016   CO2 27 01/23/2016   TSH 0.75 01/23/2016   HGBA1C 5.7 01/23/2016   BP Readings from Last 3 Encounters:  02/03/21 110/70  02/09/18 120/72  01/30/18 116/62    ASSESSMENT AND PLAN:  Discussed the following assessment and plan:  No diagnosis found.  -Patient advised to return or notify health care team  if  new concerns arise.  There are no Patient Instructions on file for this visit.   Neta Mends. Matilyn Fehrman M.D.

## 2021-10-05 ENCOUNTER — Ambulatory Visit: Payer: BC Managed Care – PPO | Admitting: Internal Medicine

## 2021-10-05 ENCOUNTER — Encounter: Payer: Self-pay | Admitting: Internal Medicine

## 2021-10-05 VITALS — BP 124/90 | HR 70 | Temp 98.5°F | Wt 167.4 lb

## 2021-10-05 DIAGNOSIS — Z79899 Other long term (current) drug therapy: Secondary | ICD-10-CM

## 2021-10-05 DIAGNOSIS — J358 Other chronic diseases of tonsils and adenoids: Secondary | ICD-10-CM

## 2021-10-05 DIAGNOSIS — F325 Major depressive disorder, single episode, in full remission: Secondary | ICD-10-CM | POA: Diagnosis not present

## 2021-10-05 MED ORDER — BUPROPION HCL ER (XL) 150 MG PO TB24
ORAL_TABLET | ORAL | 0 refills | Status: DC
Start: 2021-10-05 — End: 2022-01-11

## 2021-10-05 MED ORDER — DULOXETINE HCL 60 MG PO CPEP: 120.0000 mg | ORAL_CAPSULE | Freq: Every day | ORAL | 0 refills | Status: DC

## 2021-10-05 NOTE — Patient Instructions (Addendum)
Good to see you today  Will do ENT referral  and let us know if not contacted  In interim try  warm water gargles   frequently . And let us know if   worse.   Same meds    stay in counseling   Still advise behavioral health   prescribe.  Plan fu in 3-6 months or as indicated .

## 2021-10-07 DIAGNOSIS — F331 Major depressive disorder, recurrent, moderate: Secondary | ICD-10-CM | POA: Diagnosis not present

## 2021-10-12 ENCOUNTER — Other Ambulatory Visit: Payer: Self-pay | Admitting: Internal Medicine

## 2021-10-13 ENCOUNTER — Encounter: Payer: Self-pay | Admitting: Internal Medicine

## 2021-10-15 ENCOUNTER — Other Ambulatory Visit: Payer: Self-pay | Admitting: Internal Medicine

## 2021-10-15 DIAGNOSIS — F331 Major depressive disorder, recurrent, moderate: Secondary | ICD-10-CM | POA: Diagnosis not present

## 2021-10-15 MED ORDER — METHYLPHENIDATE HCL ER (OSM) 36 MG PO TBCR: 36.0000 mg | EXTENDED_RELEASE_TABLET | Freq: Every day | ORAL | 0 refills | Status: DC

## 2021-10-15 NOTE — Telephone Encounter (Signed)
Sent in electronically .  

## 2021-10-28 DIAGNOSIS — F331 Major depressive disorder, recurrent, moderate: Secondary | ICD-10-CM | POA: Diagnosis not present

## 2021-11-02 DIAGNOSIS — F331 Major depressive disorder, recurrent, moderate: Secondary | ICD-10-CM | POA: Diagnosis not present

## 2021-11-13 ENCOUNTER — Other Ambulatory Visit: Payer: Self-pay | Admitting: Internal Medicine

## 2021-11-16 ENCOUNTER — Other Ambulatory Visit: Payer: Self-pay | Admitting: Internal Medicine

## 2021-11-16 ENCOUNTER — Encounter: Payer: Self-pay | Admitting: Internal Medicine

## 2021-11-16 MED ORDER — METHYLPHENIDATE HCL ER (OSM) 36 MG PO TBCR: 36.0000 mg | EXTENDED_RELEASE_TABLET | Freq: Every day | ORAL | 0 refills | Status: DC

## 2021-12-14 ENCOUNTER — Other Ambulatory Visit: Payer: Self-pay | Admitting: Internal Medicine

## 2021-12-14 MED ORDER — METHYLPHENIDATE HCL ER (OSM) 36 MG PO TBCR: 36.0000 mg | EXTENDED_RELEASE_TABLET | Freq: Every day | ORAL | 0 refills | Status: DC

## 2022-01-05 ENCOUNTER — Other Ambulatory Visit: Payer: Self-pay | Admitting: Internal Medicine

## 2022-01-05 MED ORDER — DULOXETINE HCL 60 MG PO CPEP: 120.0000 mg | ORAL_CAPSULE | Freq: Every day | ORAL | 0 refills | Status: AC

## 2022-01-10 ENCOUNTER — Other Ambulatory Visit: Payer: Self-pay | Admitting: Internal Medicine

## 2022-01-13 ENCOUNTER — Other Ambulatory Visit: Payer: Self-pay | Admitting: Internal Medicine

## 2022-01-13 MED ORDER — METHYLPHENIDATE HCL ER (OSM) 36 MG PO TBCR: 36.0000 mg | EXTENDED_RELEASE_TABLET | Freq: Every day | ORAL | 0 refills | Status: DC

## 2022-01-13 NOTE — Telephone Encounter (Addendum)
Last OV-10/05/21 Last refill-12/14/21-30 tabs, 0 refills  No future OV scheduled.

## 2022-02-15 ENCOUNTER — Encounter: Payer: Self-pay | Admitting: Internal Medicine

## 2022-02-15 NOTE — Telephone Encounter (Signed)
*  Last OV-10/05/21

## 2022-02-17 MED ORDER — METHYLPHENIDATE HCL ER (OSM) 36 MG PO TBCR: 36.0000 mg | EXTENDED_RELEASE_TABLET | Freq: Every day | ORAL | 0 refills | Status: DC

## 2022-02-17 NOTE — Telephone Encounter (Signed)
I sent in the refill.

## 2022-02-18 MED ORDER — METHYLPHENIDATE HCL ER (OSM) 36 MG PO TBCR: 36.0000 mg | EXTENDED_RELEASE_TABLET | Freq: Every day | ORAL | 0 refills | Status: DC

## 2022-02-18 NOTE — Telephone Encounter (Signed)
Done

## 2022-03-10 ENCOUNTER — Telehealth (INDEPENDENT_AMBULATORY_CARE_PROVIDER_SITE_OTHER): Payer: BC Managed Care – PPO | Admitting: Internal Medicine

## 2022-03-10 ENCOUNTER — Encounter: Payer: Self-pay | Admitting: Internal Medicine

## 2022-03-10 VITALS — Ht 67.0 in | Wt 167.0 lb

## 2022-03-10 DIAGNOSIS — F900 Attention-deficit hyperactivity disorder, predominantly inattentive type: Secondary | ICD-10-CM | POA: Diagnosis not present

## 2022-03-10 DIAGNOSIS — Z79899 Other long term (current) drug therapy: Secondary | ICD-10-CM | POA: Diagnosis not present

## 2022-03-10 DIAGNOSIS — F324 Major depressive disorder, single episode, in partial remission: Secondary | ICD-10-CM

## 2022-03-10 NOTE — Progress Notes (Signed)
Virtual Visit via Video Note  I connected with [place] on 03/14/22 at  2:30 PM EST by a video enabled telemedicine application and verified that I am speaking with the correct person using two identifiers. Location patient: home Location provider:work office Persons participating in the virtual visit: patient, provider  Patient aware  of the limitations of evaluation and management by telemedicine and  availability of in person appointments. and agreed to proceed.   HPI: Pamela [name] presents for video visit regarding refill of Concerta. Visual to her  not working but  her feed is normal She currently carries a ongoing diagnosis of ADD ADHD She does have appointment finally with behavioral health person February 27. Because of her current employment she wants to continue on stimulant medications Concerta 36 mg have been helping but not as helpful when she is off of it.  Negative TAD Sleep goal is 7 hours works on it. No other major change in her health. Her mood is not well-controlled and sometimes has dark days she has done a psychiatric appointment.    ROS: See pertinent positives and negatives per HPI.  Past Medical History:  Diagnosis Date   Anxiety 10/11   Hospital Ingestion benadryl   Concussion 6/11   Depression 10/2009   Hospital ingestion benadryl   DRUG OVERDOSE 10/27/2009   Qualifier: Diagnosis of  By: Regis Bill MD, Standley Brooking    Fractured nose 6/11   LLL pneumonia 11 /12   Wheezing-associated respiratory infection (WARI) 03/06/2011    Past Surgical History:  Procedure Laterality Date   Nose fracture surgery      History reviewed. No pertinent family history.  Social History   Tobacco Use   Smoking status: Never   Smokeless tobacco: Never  Substance Use Topics   Alcohol use: Yes    Alcohol/week: 4.0 standard drinks of alcohol    Types: 4 Cans of beer per week    Comment: social   Drug use: No      Current Outpatient Medications:    buPROPion  (WELLBUTRIN XL) 150 MG 24 hr tablet, TAKE 1 TABLET(150 MG) BY MOUTH DAILY, Disp: 90 tablet, Rfl: 0   DULoxetine (CYMBALTA) 60 MG capsule, Take 2 capsules (120 mg total) by mouth daily., Disp: 180 capsule, Rfl: 0   methylphenidate (CONCERTA) 36 MG PO CR tablet, Take 1 tablet (36 mg total) by mouth daily., Disp: 30 tablet, Rfl: 0  EXAM: BP Readings from Last 3 Encounters:  10/05/21 (!) 124/90  02/03/21 110/70  02/09/18 120/72    VITALS per patient if applicable:  GENERAL: alert, oriented, appears well and in no acute distress non toxic    PSYCH/NEURO: pleasant and cooperative, no obvious depression or anxiety, speech and thought processing grossly intact  Lab Results  Component Value Date   WBC 7.9 01/23/2016   HGB 12.4 01/23/2016   HCT 37.7 01/23/2016   PLT 509.0 (H) 01/23/2016   GLUCOSE 85 01/23/2016   CHOL 155 07/05/2013   TRIG 125.0 07/05/2013   HDL 50.90 07/05/2013   LDLCALC 79 07/05/2013   ALT 16 01/23/2016   AST 16 01/23/2016   NA 138 01/23/2016   K 4.5 01/23/2016   CL 106 01/23/2016   CREATININE 0.64 01/23/2016   BUN 13 01/23/2016   CO2 27 01/23/2016   TSH 0.75 01/23/2016   HGBA1C 5.7 01/23/2016    ASSESSMENT AND PLAN:  Discussed the following assessment and plan:    ICD-10-CM   1. 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 Medication management  Z79.899     3. Major depressive disorder in partial remission, unspecified whether recurrent ([place])  F32.4    keep behavioral health appt     Concerta refilled it appears that benefit more than risk. She is currently on max Cymbalta 120 and Wellbutrin 150 a day further manipulations refills per behavioral health.  Counseled.   Expectant management and discussion of plan and treatment with opportunity to ask questions and all were answered. The patient agreed with  the plan and demonstrated an understanding of the instructions.   Advised to call back or seek an in-person evaluation if worsening  or having  further concerns  in interim. Return for med check in 4-6 months depending  ( concerta) .    Shanon Ace, MD

## 2022-03-16 DIAGNOSIS — F900 Attention-deficit hyperactivity disorder, predominantly inattentive type: Secondary | ICD-10-CM | POA: Diagnosis not present

## 2022-03-16 DIAGNOSIS — F331 Major depressive disorder, recurrent, moderate: Secondary | ICD-10-CM | POA: Diagnosis not present

## 2022-03-16 DIAGNOSIS — F4010 Social phobia, unspecified: Secondary | ICD-10-CM | POA: Diagnosis not present

## 2022-03-16 DIAGNOSIS — F1010 Alcohol abuse, uncomplicated: Secondary | ICD-10-CM | POA: Diagnosis not present

## 2022-03-17 ENCOUNTER — Other Ambulatory Visit: Payer: Self-pay | Admitting: Family Medicine

## 2022-03-18 MED ORDER — METHYLPHENIDATE HCL ER (OSM) 36 MG PO TBCR: 36.0000 mg | EXTENDED_RELEASE_TABLET | Freq: Every day | ORAL | 0 refills | Status: DC

## 2022-03-20 ENCOUNTER — Other Ambulatory Visit: Payer: Self-pay | Admitting: Internal Medicine

## 2022-03-22 ENCOUNTER — Ambulatory Visit (INDEPENDENT_AMBULATORY_CARE_PROVIDER_SITE_OTHER): Payer: BC Managed Care – PPO | Admitting: Psychiatry

## 2022-03-22 ENCOUNTER — Encounter: Payer: Self-pay | Admitting: Internal Medicine

## 2022-04-06 DIAGNOSIS — F331 Major depressive disorder, recurrent, moderate: Secondary | ICD-10-CM | POA: Diagnosis not present

## 2022-04-06 DIAGNOSIS — F1010 Alcohol abuse, uncomplicated: Secondary | ICD-10-CM | POA: Diagnosis not present

## 2022-04-06 DIAGNOSIS — F4010 Social phobia, unspecified: Secondary | ICD-10-CM | POA: Diagnosis not present

## 2022-04-06 DIAGNOSIS — F900 Attention-deficit hyperactivity disorder, predominantly inattentive type: Secondary | ICD-10-CM | POA: Diagnosis not present

## 2022-06-09 DIAGNOSIS — F331 Major depressive disorder, recurrent, moderate: Secondary | ICD-10-CM | POA: Diagnosis not present

## 2022-06-18 DIAGNOSIS — F331 Major depressive disorder, recurrent, moderate: Secondary | ICD-10-CM | POA: Diagnosis not present

## 2022-06-23 DIAGNOSIS — F331 Major depressive disorder, recurrent, moderate: Secondary | ICD-10-CM | POA: Diagnosis not present

## 2022-06-30 DIAGNOSIS — F331 Major depressive disorder, recurrent, moderate: Secondary | ICD-10-CM | POA: Diagnosis not present

## 2022-06-30 DIAGNOSIS — F1010 Alcohol abuse, uncomplicated: Secondary | ICD-10-CM | POA: Diagnosis not present

## 2022-06-30 DIAGNOSIS — F4010 Social phobia, unspecified: Secondary | ICD-10-CM | POA: Diagnosis not present

## 2022-06-30 DIAGNOSIS — F900 Attention-deficit hyperactivity disorder, predominantly inattentive type: Secondary | ICD-10-CM | POA: Diagnosis not present

## 2022-07-21 DIAGNOSIS — F411 Generalized anxiety disorder: Secondary | ICD-10-CM | POA: Diagnosis not present

## 2022-07-21 DIAGNOSIS — F4310 Post-traumatic stress disorder, unspecified: Secondary | ICD-10-CM | POA: Diagnosis not present

## 2022-09-29 DIAGNOSIS — F411 Generalized anxiety disorder: Secondary | ICD-10-CM | POA: Diagnosis not present

## 2022-09-29 DIAGNOSIS — F4310 Post-traumatic stress disorder, unspecified: Secondary | ICD-10-CM | POA: Diagnosis not present

## 2022-11-10 DIAGNOSIS — F4310 Post-traumatic stress disorder, unspecified: Secondary | ICD-10-CM | POA: Diagnosis not present

## 2022-11-10 DIAGNOSIS — F411 Generalized anxiety disorder: Secondary | ICD-10-CM | POA: Diagnosis not present

## 2022-11-17 DIAGNOSIS — F4310 Post-traumatic stress disorder, unspecified: Secondary | ICD-10-CM | POA: Diagnosis not present

## 2022-11-17 DIAGNOSIS — F411 Generalized anxiety disorder: Secondary | ICD-10-CM | POA: Diagnosis not present

## 2022-11-19 DIAGNOSIS — L0202 Furuncle of face: Secondary | ICD-10-CM | POA: Diagnosis not present

## 2022-11-19 DIAGNOSIS — L700 Acne vulgaris: Secondary | ICD-10-CM | POA: Diagnosis not present

## 2022-11-22 DIAGNOSIS — F331 Major depressive disorder, recurrent, moderate: Secondary | ICD-10-CM | POA: Diagnosis not present

## 2022-11-22 DIAGNOSIS — F411 Generalized anxiety disorder: Secondary | ICD-10-CM | POA: Diagnosis not present

## 2022-11-22 DIAGNOSIS — F900 Attention-deficit hyperactivity disorder, predominantly inattentive type: Secondary | ICD-10-CM | POA: Diagnosis not present

## 2022-11-24 DIAGNOSIS — F411 Generalized anxiety disorder: Secondary | ICD-10-CM | POA: Diagnosis not present

## 2022-11-24 DIAGNOSIS — F4310 Post-traumatic stress disorder, unspecified: Secondary | ICD-10-CM | POA: Diagnosis not present

## 2022-12-01 DIAGNOSIS — F411 Generalized anxiety disorder: Secondary | ICD-10-CM | POA: Diagnosis not present

## 2022-12-01 DIAGNOSIS — F4310 Post-traumatic stress disorder, unspecified: Secondary | ICD-10-CM | POA: Diagnosis not present

## 2022-12-07 DIAGNOSIS — F1291 Cannabis use, unspecified, in remission: Secondary | ICD-10-CM | POA: Diagnosis not present

## 2022-12-07 DIAGNOSIS — J358 Other chronic diseases of tonsils and adenoids: Secondary | ICD-10-CM | POA: Diagnosis not present

## 2022-12-08 ENCOUNTER — Encounter: Payer: Self-pay | Admitting: Psychiatry

## 2022-12-08 DIAGNOSIS — F411 Generalized anxiety disorder: Secondary | ICD-10-CM | POA: Diagnosis not present

## 2022-12-09 DIAGNOSIS — F411 Generalized anxiety disorder: Secondary | ICD-10-CM | POA: Diagnosis not present

## 2022-12-10 DIAGNOSIS — F411 Generalized anxiety disorder: Secondary | ICD-10-CM | POA: Diagnosis not present

## 2022-12-13 DIAGNOSIS — F411 Generalized anxiety disorder: Secondary | ICD-10-CM | POA: Diagnosis not present

## 2022-12-14 DIAGNOSIS — F411 Generalized anxiety disorder: Secondary | ICD-10-CM | POA: Diagnosis not present

## 2022-12-15 DIAGNOSIS — F411 Generalized anxiety disorder: Secondary | ICD-10-CM | POA: Diagnosis not present

## 2022-12-16 DIAGNOSIS — F411 Generalized anxiety disorder: Secondary | ICD-10-CM | POA: Diagnosis not present

## 2022-12-17 DIAGNOSIS — F411 Generalized anxiety disorder: Secondary | ICD-10-CM | POA: Diagnosis not present

## 2022-12-20 DIAGNOSIS — F411 Generalized anxiety disorder: Secondary | ICD-10-CM | POA: Diagnosis not present

## 2022-12-21 DIAGNOSIS — F411 Generalized anxiety disorder: Secondary | ICD-10-CM | POA: Diagnosis not present

## 2022-12-22 DIAGNOSIS — F411 Generalized anxiety disorder: Secondary | ICD-10-CM | POA: Diagnosis not present

## 2022-12-27 DIAGNOSIS — F411 Generalized anxiety disorder: Secondary | ICD-10-CM | POA: Diagnosis not present

## 2022-12-28 DIAGNOSIS — F411 Generalized anxiety disorder: Secondary | ICD-10-CM | POA: Diagnosis not present

## 2022-12-30 DIAGNOSIS — F411 Generalized anxiety disorder: Secondary | ICD-10-CM | POA: Diagnosis not present

## 2022-12-31 DIAGNOSIS — F411 Generalized anxiety disorder: Secondary | ICD-10-CM | POA: Diagnosis not present

## 2023-01-03 DIAGNOSIS — F411 Generalized anxiety disorder: Secondary | ICD-10-CM | POA: Diagnosis not present

## 2023-01-04 DIAGNOSIS — F411 Generalized anxiety disorder: Secondary | ICD-10-CM | POA: Diagnosis not present

## 2023-01-05 DIAGNOSIS — F411 Generalized anxiety disorder: Secondary | ICD-10-CM | POA: Diagnosis not present

## 2023-01-06 DIAGNOSIS — F411 Generalized anxiety disorder: Secondary | ICD-10-CM | POA: Diagnosis not present

## 2023-01-07 DIAGNOSIS — F411 Generalized anxiety disorder: Secondary | ICD-10-CM | POA: Diagnosis not present

## 2023-01-10 DIAGNOSIS — F411 Generalized anxiety disorder: Secondary | ICD-10-CM | POA: Diagnosis not present

## 2023-01-11 DIAGNOSIS — F331 Major depressive disorder, recurrent, moderate: Secondary | ICD-10-CM | POA: Diagnosis not present

## 2023-01-11 DIAGNOSIS — F900 Attention-deficit hyperactivity disorder, predominantly inattentive type: Secondary | ICD-10-CM | POA: Diagnosis not present

## 2023-01-11 DIAGNOSIS — F4010 Social phobia, unspecified: Secondary | ICD-10-CM | POA: Diagnosis not present

## 2023-01-11 DIAGNOSIS — F411 Generalized anxiety disorder: Secondary | ICD-10-CM | POA: Diagnosis not present

## 2023-01-12 DIAGNOSIS — F411 Generalized anxiety disorder: Secondary | ICD-10-CM | POA: Diagnosis not present

## 2023-01-13 DIAGNOSIS — F411 Generalized anxiety disorder: Secondary | ICD-10-CM | POA: Diagnosis not present

## 2023-01-17 DIAGNOSIS — F411 Generalized anxiety disorder: Secondary | ICD-10-CM | POA: Diagnosis not present

## 2023-01-18 DIAGNOSIS — F411 Generalized anxiety disorder: Secondary | ICD-10-CM | POA: Diagnosis not present

## 2023-01-20 DIAGNOSIS — F411 Generalized anxiety disorder: Secondary | ICD-10-CM | POA: Diagnosis not present

## 2023-01-21 DIAGNOSIS — F411 Generalized anxiety disorder: Secondary | ICD-10-CM | POA: Diagnosis not present

## 2023-01-24 DIAGNOSIS — F411 Generalized anxiety disorder: Secondary | ICD-10-CM | POA: Diagnosis not present

## 2023-01-25 DIAGNOSIS — F411 Generalized anxiety disorder: Secondary | ICD-10-CM | POA: Diagnosis not present

## 2023-01-27 DIAGNOSIS — F411 Generalized anxiety disorder: Secondary | ICD-10-CM | POA: Diagnosis not present

## 2023-01-28 DIAGNOSIS — F411 Generalized anxiety disorder: Secondary | ICD-10-CM | POA: Diagnosis not present

## 2023-01-31 DIAGNOSIS — F411 Generalized anxiety disorder: Secondary | ICD-10-CM | POA: Diagnosis not present

## 2023-02-01 DIAGNOSIS — F411 Generalized anxiety disorder: Secondary | ICD-10-CM | POA: Diagnosis not present

## 2023-02-02 DIAGNOSIS — F411 Generalized anxiety disorder: Secondary | ICD-10-CM | POA: Diagnosis not present

## 2023-02-03 ENCOUNTER — Ambulatory Visit: Payer: Self-pay | Admitting: Internal Medicine

## 2023-02-03 NOTE — Telephone Encounter (Signed)
  Chief Complaint: Cat bite Symptoms: puncture wounds on R thumb Frequency: yesterday  Pertinent Negatives: Patient denies streaking, fever Disposition: [] ED /[x] Urgent Care (no appt availability in office) / [] Appointment(In office/virtual)/ []  [place] [facility]/ [] Home Care/ [] Refused Recommended Disposition /[] [facility]/ []  Follow-up with PCP Additional Notes: Patient calls stating she was trying to help a friend get her cat into a cage and the cat bit her on her R thumb last night. She states the cat is UTD on vaccines, reports there are 4 dots total in the bite area, kind of deep. Pt denies streaking, fever. Per protocol, pt to be evaluated within 4 hours. Next available appt with any provider 1/10 @ 1000. Patient referred to urgent care, care advice reviewed, patient verbalized understanding. Alerting PCP for review.   Copied from CRM [id number]. Topic: Clinical - Red Word Triage >> Feb 03, 2023  9:37 AM Rolin D wrote: Red Word that prompted transfer to Nurse Triage: bitten by cat Reason for Disposition  [1] Puncture wound (hole through the skin) AND [2] from a cat bite (or deep claw puncture wound)  Answer Assessment - Initial Assessment Questions 1. ANIMAL: What type of animal caused the bite? Is the injury from a bite or a claw? If the animal is a dog or a cat, ask: Was it a pet or a stray? Was it acting ill or behaving strangely?     Cat- it is a friends house cat- reports shots up to date. 2. LOCATION: Where is the bite located?      Thumb on R hand 3. SIZE: How big is the bite? What does it look like?      Three small dots- seems kind of deep 4. ONSET: When did the bite happen? (Minutes or hours ago)      Approx 1900 last night 5. CIRCUMSTANCES: Tell me how this happened.      Was trying to help a friend get her cat in a crate. 6. TETANUS: When was your last tetanus booster?     Unsure 7. RABIES VACCINE: For dog or cat bites, ask: Do you  know if the pet is vaccinated against rabies?  (e.g., yes, no, overdue for rabies shot, unknown)     She reports vaccines are up to date.  Protocols used: Animal Bite-A-AH

## 2023-02-08 DIAGNOSIS — F900 Attention-deficit hyperactivity disorder, predominantly inattentive type: Secondary | ICD-10-CM | POA: Diagnosis not present

## 2023-02-08 DIAGNOSIS — F4010 Social phobia, unspecified: Secondary | ICD-10-CM | POA: Diagnosis not present

## 2023-02-08 DIAGNOSIS — F331 Major depressive disorder, recurrent, moderate: Secondary | ICD-10-CM | POA: Diagnosis not present

## 2023-02-10 DIAGNOSIS — F411 Generalized anxiety disorder: Secondary | ICD-10-CM | POA: Diagnosis not present

## 2023-03-09 DIAGNOSIS — F411 Generalized anxiety disorder: Secondary | ICD-10-CM | POA: Diagnosis not present

## 2023-03-22 DIAGNOSIS — L0292 Furuncle, unspecified: Secondary | ICD-10-CM | POA: Diagnosis not present

## 2023-03-22 DIAGNOSIS — L700 Acne vulgaris: Secondary | ICD-10-CM | POA: Diagnosis not present

## 2023-04-08 DIAGNOSIS — F331 Major depressive disorder, recurrent, moderate: Secondary | ICD-10-CM | POA: Diagnosis not present

## 2023-04-08 DIAGNOSIS — F1010 Alcohol abuse, uncomplicated: Secondary | ICD-10-CM | POA: Diagnosis not present

## 2023-04-08 DIAGNOSIS — F900 Attention-deficit hyperactivity disorder, predominantly inattentive type: Secondary | ICD-10-CM | POA: Diagnosis not present

## 2023-04-08 DIAGNOSIS — F4010 Social phobia, unspecified: Secondary | ICD-10-CM | POA: Diagnosis not present

## 2023-07-19 DIAGNOSIS — F900 Attention-deficit hyperactivity disorder, predominantly inattentive type: Secondary | ICD-10-CM | POA: Diagnosis not present

## 2023-07-19 DIAGNOSIS — F4010 Social phobia, unspecified: Secondary | ICD-10-CM | POA: Diagnosis not present

## 2023-07-19 DIAGNOSIS — F331 Major depressive disorder, recurrent, moderate: Secondary | ICD-10-CM | POA: Diagnosis not present

## 2023-09-13 DIAGNOSIS — F4010 Social phobia, unspecified: Secondary | ICD-10-CM | POA: Diagnosis not present

## 2023-09-13 DIAGNOSIS — F331 Major depressive disorder, recurrent, moderate: Secondary | ICD-10-CM | POA: Diagnosis not present

## 2023-09-13 DIAGNOSIS — F900 Attention-deficit hyperactivity disorder, predominantly inattentive type: Secondary | ICD-10-CM | POA: Diagnosis not present

## 2023-10-21 DIAGNOSIS — L708 Other acne: Secondary | ICD-10-CM | POA: Diagnosis not present

## 2023-10-21 DIAGNOSIS — L710 Perioral dermatitis: Secondary | ICD-10-CM | POA: Diagnosis not present

## 2023-10-21 DIAGNOSIS — L705 Acne excoriee des jeunes filles: Secondary | ICD-10-CM | POA: Diagnosis not present

## 2023-11-11 DIAGNOSIS — F4010 Social phobia, unspecified: Secondary | ICD-10-CM | POA: Diagnosis not present

## 2023-11-11 DIAGNOSIS — F900 Attention-deficit hyperactivity disorder, predominantly inattentive type: Secondary | ICD-10-CM | POA: Diagnosis not present

## 2023-11-11 DIAGNOSIS — F331 Major depressive disorder, recurrent, moderate: Secondary | ICD-10-CM | POA: Diagnosis not present

## 2023-11-21 ENCOUNTER — Other Ambulatory Visit: Payer: Self-pay

## 2023-11-24 ENCOUNTER — Telehealth: Payer: Self-pay

## 2023-11-24 DIAGNOSIS — Z0000 Encounter for general adult medical examination without abnormal findings: Secondary | ICD-10-CM

## 2023-11-24 DIAGNOSIS — F900 Attention-deficit hyperactivity disorder, predominantly inattentive type: Secondary | ICD-10-CM

## 2023-11-24 DIAGNOSIS — Z1159 Encounter for screening for other viral diseases: Secondary | ICD-10-CM

## 2023-11-24 DIAGNOSIS — Z79899 Other long term (current) drug therapy: Secondary | ICD-10-CM

## 2023-11-24 NOTE — Telephone Encounter (Signed)
 Copied from CRM [id number]. Topic: General - Other >> Nov 21, 2023 11:24 AM [name] HERO wrote: Reason for CRM: patient requesting the during labs can she get her theroid levels checked

## 2023-11-29 NOTE — Addendum Note (Signed)
 Addended byBETHA CHARLETT APOLINAR MARLA on: 11/29/2023 05:25 PM   Modules accepted: Orders

## 2023-11-29 NOTE — Telephone Encounter (Signed)
 Future labs have been placed for cpe to include thyroid andcholesterol blood sugare andmeia check

## 2023-11-30 NOTE — Telephone Encounter (Signed)
 Attempted to reach pt. Left a voicemail to call us back.

## 2023-12-01 NOTE — Telephone Encounter (Signed)
 Attempted to reach pt. Left a voicemail to call us  back to set up lab appt.

## 2023-12-06 ENCOUNTER — Telehealth: Payer: Self-pay

## 2023-12-06 NOTE — Telephone Encounter (Signed)
 Attempted to reach to pt. Left voicemail, pt can drink black coffee and or water.

## 2023-12-06 NOTE — Telephone Encounter (Signed)
 Copied from CRM [id number]. Topic: Clinical - Request for Lab/Test Order >> Dec 06, 2023  3:52 PM Mia F wrote: Reason for CRM: Pt scheduled her lab appt. She want to know if she could have coffee before labs

## 2023-12-07 ENCOUNTER — Other Ambulatory Visit

## 2023-12-07 DIAGNOSIS — Z1159 Encounter for screening for other viral diseases: Secondary | ICD-10-CM | POA: Diagnosis not present

## 2023-12-07 DIAGNOSIS — F411 Generalized anxiety disorder: Secondary | ICD-10-CM | POA: Diagnosis not present

## 2023-12-07 DIAGNOSIS — Z79899 Other long term (current) drug therapy: Secondary | ICD-10-CM

## 2023-12-07 DIAGNOSIS — F331 Major depressive disorder, recurrent, moderate: Secondary | ICD-10-CM | POA: Diagnosis not present

## 2023-12-07 DIAGNOSIS — F900 Attention-deficit hyperactivity disorder, predominantly inattentive type: Secondary | ICD-10-CM | POA: Diagnosis not present

## 2023-12-07 DIAGNOSIS — Z0000 Encounter for general adult medical examination without abnormal findings: Secondary | ICD-10-CM

## 2023-12-07 LAB — CBC WITH DIFFERENTIAL/PLATELET
Basophils Absolute: 0 K/uL (ref 0.0–0.1)
Basophils Relative: 0.6 % (ref 0.0–3.0)
Eosinophils Absolute: 0.1 K/uL (ref 0.0–0.7)
Eosinophils Relative: 1 % (ref 0.0–5.0)
HCT: 36.5 % (ref 36.0–46.0)
Hemoglobin: 12.3 g/dL (ref 12.0–15.0)
Lymphocytes Relative: 36.9 % (ref 12.0–46.0)
Lymphs Abs: 2 K/uL (ref 0.7–4.0)
MCHC: 33.8 g/dL (ref 30.0–36.0)
MCV: 89.5 fl (ref 78.0–100.0)
Monocytes Absolute: 0.3 K/uL (ref 0.1–1.0)
Monocytes Relative: 6 % (ref 3.0–12.0)
Neutro Abs: 3.1 K/uL (ref 1.4–7.7)
Neutrophils Relative %: 55.5 % (ref 43.0–77.0)
Platelets: 405 K/uL — ABNORMAL HIGH (ref 150.0–400.0)
RBC: 4.08 Mil/uL (ref 3.87–5.11)
RDW: 13.9 % (ref 11.5–15.5)
WBC: 5.5 K/uL (ref 4.0–10.5)

## 2023-12-07 LAB — HEPATIC FUNCTION PANEL
ALT: 21 U/L (ref 0–35)
AST: 26 U/L (ref 0–37)
Albumin: 4.7 g/dL (ref 3.5–5.2)
Alkaline Phosphatase: 42 U/L (ref 39–117)
Bilirubin, Direct: 0.1 mg/dL (ref 0.0–0.3)
Total Bilirubin: 0.3 mg/dL (ref 0.2–1.2)
Total Protein: 7 g/dL (ref 6.0–8.3)

## 2023-12-07 LAB — BASIC METABOLIC PANEL WITH GFR
BUN: 19 mg/dL (ref 6–23)
CO2: 26 meq/L (ref 19–32)
Calcium: 9.2 mg/dL (ref 8.4–10.5)
Chloride: 105 meq/L (ref 96–112)
Creatinine, Ser: 0.73 mg/dL (ref 0.40–1.20)
GFR: 111.48 mL/min (ref >60.00)
Glucose, Bld: 82 mg/dL (ref 70–99)
Potassium: 4.5 meq/L (ref 3.5–5.1)
Sodium: 138 meq/L (ref 135–145)

## 2023-12-07 LAB — HEMOGLOBIN A1C: Hgb A1c MFr Bld: 5.6 % (ref 4.6–6.5)

## 2023-12-07 LAB — LIPID PANEL
Cholesterol: 145 mg/dL (ref 0–200)
HDL: 53.8 mg/dL (ref >39.00)
LDL Cholesterol: 83 mg/dL (ref 0–99)
NonHDL: 90.84
Total CHOL/HDL Ratio: 3
Triglycerides: 40 mg/dL (ref 0.0–149.0)
VLDL: 8 mg/dL (ref 0.0–40.0)

## 2023-12-07 LAB — TSH: TSH: 1.72 u[IU]/mL (ref 0.35–5.50)

## 2023-12-08 LAB — HEPATITIS C ANTIBODY: Hepatitis C Ab: NONREACTIVE

## 2023-12-13 ENCOUNTER — Ambulatory Visit: Admitting: Internal Medicine

## 2023-12-13 ENCOUNTER — Other Ambulatory Visit: Payer: Self-pay | Admitting: Internal Medicine

## 2023-12-13 VITALS — BP 106/76 | HR 89 | Temp 97.6°F | Ht 67.0 in | Wt 146.4 lb

## 2023-12-13 DIAGNOSIS — T148XXA Other injury of unspecified body region, initial encounter: Secondary | ICD-10-CM | POA: Diagnosis not present

## 2023-12-13 DIAGNOSIS — Z23 Encounter for immunization: Secondary | ICD-10-CM | POA: Diagnosis not present

## 2023-12-13 DIAGNOSIS — Z0000 Encounter for general adult medical examination without abnormal findings: Secondary | ICD-10-CM | POA: Diagnosis not present

## 2023-12-13 DIAGNOSIS — Z79899 Other long term (current) drug therapy: Secondary | ICD-10-CM

## 2023-12-13 DIAGNOSIS — R5383 Other fatigue: Secondary | ICD-10-CM | POA: Diagnosis not present

## 2023-12-13 DIAGNOSIS — R7989 Other specified abnormal findings of blood chemistry: Secondary | ICD-10-CM | POA: Diagnosis not present

## 2023-12-13 NOTE — Progress Notes (Signed)
 Chief Complaint  Patient presents with   Annual Exam    Pt reports she is not fasting. Would like to discuss lab on thyroid, feeling tired. Pt states it might be from her menstrual cycle.     HPI: Patient  Pamela [name]  28 y.o. comes in today for Preventive Health Care visit   but also feels tired a lot and tends  to bruise a good bit mostly legs . No other bleeding  Under care for sig anxiety  counselor and  prescriber  on buspar , cymbalta  and Strattera. . No unusual bleeding. Periods nl  Last pap at [facility] years ago   No sa and says not at risk   or having sx of sti .  Ok for flu vaccine today  Health Maintenance  Topic Date Due   HIV Screening  Never done   Cervical Cancer Screening (Pap smear)  Never done   Hepatitis B Vaccines 19-59 Average Risk (2 of 3 - 19+ 3-dose series) 10/20/2020   COVID-19 Vaccine (4 - 2025-26 season) 12/29/2023 (Originally 09/26/2023)   DTaP/Tdap/Td (3 - Td or Tdap) 02/10/2028   Influenza Vaccine  Completed   HPV VACCINES  Completed   Hepatitis C Screening  Completed   Pneumococcal Vaccine  Aged Out   Meningococcal B Vaccine  Aged Out   Health Maintenance Review LIFESTYLE:  Exercise:   every day  strength    Tobacco/ETS: no Alcohol:  no Sugar beverages: no Sleep: 6-8 hours  Drug use: no HH of  with parents  again    2 dogs  Work: pet sitting    Supp    lthionine and aswegonda  and mag clycenate.  [name] rewards at [employer] and therapist .  Periods   not as painful.  Monthly  5 days .    ROS:  GEN/ HEENT: No fever, significant weight changes sweats headaches vision problems hearing changes, CV/ PULM; No chest pain shortness of breath cough, syncope,edema  change in exercise tolerance. GI /GU: No adominal pain, vomiting, change in bowel habits. No blood in the stool. No significant GU symptoms. SKIN/HEME: ,no acute skin rashes suspicious lesions or bleeding. No lymphadenopathy, nodules, masses.  NEURO/ PSYCH:  No neurologic signs such as  weakness numbness. No depression anxiety. IMM/ Allergy: No unusual infections.  Allergy .   REST of 12 system review negative except as per HPI   Past Medical History:  Diagnosis Date   Anxiety 10/11   Hospital Ingestion benadryl   Concussion 6/11   Depression 10/2009   Hospital ingestion benadryl   DRUG OVERDOSE 10/27/2009   Qualifier: Diagnosis of  By: Charlett MD, Apolinar POUR    Fractured nose 6/11   LLL pneumonia 11 /12   Wheezing-associated respiratory infection (WARI) 03/06/2011    Past Surgical History:  Procedure Laterality Date   Nose fracture surgery      No family history on file.  Social History   Socioeconomic History   Marital status: Single    Spouse name: Not on file   Number of children: Not on file   Years of education: Not on file   Highest education level: Master's degree (e.g., MA, MS, MEng, MEd, MSW, MBA)  Occupational History   Not on file  Tobacco Use   Smoking status: Never   Smokeless tobacco: Never  Substance and Sexual Activity   Alcohol use: Yes    Alcohol/week: 4.0 standard drinks of alcohol    Types: 4 Cans of beer  per week    Comment: social   Drug use: No   Sexual activity: Not Currently  Other Topics Concern   Not on file  Social History Narrative   Mgm dm cad ?af on coumadin   ? If pgf undiagnosed depression   Paternal great aunt   Mom on wellbutrin  and effexor for mom   ? 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   Social Drivers of Health   Financial Resource Strain: Medium Risk (12/13/2023)   Overall Financial Resource Strain (CARDIA)    Difficulty of Paying Living Expenses: Somewhat hard  Food Insecurity: No Food Insecurity (12/13/2023)   Hunger Vital Sign    Worried About Running Out of Food in the Last Year: Never true    Ran Out of Food in the Last Year: Never true  Transportation Needs: No Transportation Needs (12/13/2023)   PRAPARE -  Administrator, Civil Service (Medical): No    Lack of Transportation (Non-Medical): No  Physical Activity: Sufficiently Active (12/13/2023)   Exercise Vital Sign    Days of Exercise per Week: 7 days    Minutes of Exercise per Session: 80 min  Stress: No Stress Concern Present (12/13/2023)   [employer] of Occupational Health - Occupational Stress Questionnaire    Feeling of Stress: Only a little  Social Connections: Socially Isolated (12/13/2023)   Social Connection and Isolation Panel    Frequency of Communication with Friends and Family: Never    Frequency of Social Gatherings with Friends and Family: Never    Attends Religious Services: Never    Database Administrator or Organizations: No    Attends Engineer, Structural: Not on file    Marital Status: Never married    Outpatient Medications Prior to Visit  Medication Sig Dispense Refill   atomoxetine (STRATTERA) 60 MG capsule Take 60 mg by mouth every morning.     busPIRone (BUSPAR) 15 MG tablet Take 15 mg by mouth 2 (two) times daily.     doxycycline (VIBRA-TABS) 100 MG tablet Take 100 mg by mouth daily.     DULoxetine  (CYMBALTA ) 60 MG capsule Take 2 capsules (120 mg total) by mouth daily. 180 capsule 0   buPROPion  (WELLBUTRIN  XL) 150 MG 24 hr tablet TAKE 1 TABLET(150 MG) BY MOUTH DAILY (Patient not taking: Reported on 12/13/2023) 90 tablet 0   methylphenidate  (CONCERTA ) 36 MG PO CR tablet Take 1 tablet (36 mg total) by mouth daily. (Patient not taking: Reported on 12/13/2023) 30 tablet 0   No facility-administered medications prior to visit.     EXAM:  BP 106/76 (BP Location: Right Arm, Patient Position: Sitting, Cuff Size: Normal)   Pulse 89   Temp 97.6 F (36.4 C) (Oral)   Ht 5' 7 (1.702 m)   Wt 146 lb 6.4 oz (66.4 kg)   LMP 12/07/2023 (Approximate)   SpO2 98%   BMI 22.93 kg/m   Body mass index is 22.93 kg/m. Wt Readings from Last 3 Encounters:  12/13/23 146 lb 6.4 oz (66.4 kg)   03/10/22 167 lb (75.8 kg)  10/05/21 167 lb 6.4 oz (75.9 kg)    Physical Exam: Vital signs reviewed HZW:Uypd is a well-developed well-nourished alert cooperative    who appearsr stated age in no acute distress.  HEENT: normocephalic atraumatic , Eyes: PERRL EOM's full, conjunctiva  clear, Nares: paten,t no deformity discharge or tenderness., Ears: no deformity EAC's clear TMs with normal landmarks. Mouth: clear OP, no lesions, edema.  Moist mucous membranes. Dentition in adequate repair. NECK: supple without masses, thyromegaly or bruits. CHEST/PULM:  Clear to auscultation and percussion breath sounds equal no wheeze , rales or rhonchi. No chest wall deformities or tenderness. Breast: normal by inspection . No dimpling, discharge, masses, tenderness or discharge . CV: PMI is nondisplaced, S1 S2 no gallops, murmurs, rubs. Peripheral pulses are full without delay.No JVD .  ABDOMEN: Bowel sounds normal nontender  No guard or rebound, no hepato splenomegal no CVA tenderness.  No hernia. Extremtities:  No clubbing cyanosis or edema, no acute joint swelling or redness no focal atrophy NEURO:  Oriented x3, cranial nerves 3-12 appear to be intact, no obvious focal weakness,gait within normal limits no abnormal reflexes or asymmetrical SKIN: No acute rashes normal turgor, color, or petechiae. Fading round green grey bruises on legs inside left thigh r shin  no petechia or new acute bruising  PSYCH: Oriented, good eye contact, no obvious depression anxiety, cognition and judgment appear normal. LN: no cervical axillary adenopathy  Lab Results  Component Value Date   WBC 5.5 12/07/2023   HGB 12.3 12/07/2023   HCT 36.5 12/07/2023   PLT 405.0 (H) 12/07/2023   GLUCOSE 82 12/07/2023   CHOL 145 12/07/2023   TRIG 40.0 12/07/2023   HDL 53.80 12/07/2023   LDLCALC 83 12/07/2023   ALT 21 12/07/2023   AST 26 12/07/2023   NA 138 12/07/2023   K 4.5 12/07/2023   CL 105 12/07/2023   CREATININE 0.73  12/07/2023   BUN 19 12/07/2023   CO2 26 12/07/2023   TSH 1.72 12/07/2023   HGBA1C 5.6 12/07/2023    BP Readings from Last 3 Encounters:  12/13/23 106/76  10/05/21 (!) 124/90  02/03/21 110/70    Lab results reviewed with patient  Results  in range x mild elevation platelets ns poss irondefic or inflammation    ASSESSMENT AND PLAN:  Discussed the following assessment and plan:    ICD-10-CM   1. Visit for preventive health examination  Z00.00     2. Bruising  T14.8XXA     3. Medication management  Z79.899     4. Influenza vaccine needed  Z23 Flu vaccine trivalent PF, 6mos and older(Flulaval,Afluria,Fluarix,Fluzone)    5. Tired  R53.83     6. Elevated platelet count  R79.89    minimal  poss irone defic infalmmation follow    Suspect fatigue is multifactorial )   bruising is not alarming but  not easily explainable except she is taking meds that effect the serotonin  system and act as antiplatelet.  Will follow    Caution with supplements and poss side effects med IA  Low risk but due for pap  Plan fu cbc iron studies b12  celiac  and then fu  Return for lab in about 4 months and then OV  fu and pap .  Patient Care Team: Charlett Apolinar POUR, MD as PCP - General Dominick Sonny Merritt, Harlene Bonine, MD (Psychiatry) Anderson, Dena D, PA-C (Physician Assistant) Patient Instructions  Good to see you today .  Burising  could be from  medications  blood count is reassuring .   May have mild iron deficiency.  But  no anemia   Take iron supplement   Plan fu lab in 4 months approx and then ov  can do pap at that time.  Fatigue could be  a combo of meds anxiety and iron defiiciency . Caution with supplements hard to tell interactions at this time.   Mckinnley Cottier K. Rosalynd Mcwright M.D.

## 2023-12-13 NOTE — Progress Notes (Signed)
 Future labs to be done befofe next visit in 4 mos

## 2023-12-13 NOTE — Patient Instructions (Signed)
 Good to see you today .  Burising  could be from  medications  blood count is reassuring .   May have mild iron deficiency.  But  no anemia   Take iron supplement   Plan fu lab in 4 months approx and then ov  can do pap at that time.  Fatigue could be  a combo of meds anxiety and iron defiiciency . Caution with supplements hard to tell interactions at this time.

## 2023-12-14 DIAGNOSIS — F331 Major depressive disorder, recurrent, moderate: Secondary | ICD-10-CM | POA: Diagnosis not present

## 2023-12-14 DIAGNOSIS — F411 Generalized anxiety disorder: Secondary | ICD-10-CM | POA: Diagnosis not present

## 2023-12-27 DIAGNOSIS — F411 Generalized anxiety disorder: Secondary | ICD-10-CM | POA: Diagnosis not present

## 2023-12-27 DIAGNOSIS — F331 Major depressive disorder, recurrent, moderate: Secondary | ICD-10-CM | POA: Diagnosis not present

## 2024-01-03 DIAGNOSIS — F331 Major depressive disorder, recurrent, moderate: Secondary | ICD-10-CM | POA: Diagnosis not present

## 2024-01-03 DIAGNOSIS — F411 Generalized anxiety disorder: Secondary | ICD-10-CM | POA: Diagnosis not present
